# Patient Record
Sex: Female | Born: 1975 | Race: Black or African American | Hispanic: No | Marital: Single | Smoking: Never smoker
Health system: Southern US, Community
[De-identification: ages and names within clinical notes are randomized; demographics above are authoritative.]

## PROBLEM LIST (undated history)

## (undated) DIAGNOSIS — R918 Other nonspecific abnormal finding of lung field: Secondary | ICD-10-CM

## (undated) DIAGNOSIS — I5032 Chronic diastolic (congestive) heart failure: Secondary | ICD-10-CM

## (undated) DIAGNOSIS — K219 Gastro-esophageal reflux disease without esophagitis: Secondary | ICD-10-CM

## (undated) DIAGNOSIS — I351 Nonrheumatic aortic (valve) insufficiency: Secondary | ICD-10-CM

## (undated) DIAGNOSIS — I10 Essential (primary) hypertension: Secondary | ICD-10-CM

## (undated) DIAGNOSIS — J45909 Unspecified asthma, uncomplicated: Secondary | ICD-10-CM

## (undated) DIAGNOSIS — F32A Depression, unspecified: Secondary | ICD-10-CM

## (undated) DIAGNOSIS — E119 Type 2 diabetes mellitus without complications: Secondary | ICD-10-CM

## (undated) DIAGNOSIS — E785 Hyperlipidemia, unspecified: Secondary | ICD-10-CM

## (undated) DIAGNOSIS — I340 Nonrheumatic mitral (valve) insufficiency: Secondary | ICD-10-CM

## (undated) DIAGNOSIS — F329 Major depressive disorder, single episode, unspecified: Secondary | ICD-10-CM

## (undated) DIAGNOSIS — F419 Anxiety disorder, unspecified: Secondary | ICD-10-CM

## (undated) DIAGNOSIS — I2510 Atherosclerotic heart disease of native coronary artery without angina pectoris: Secondary | ICD-10-CM

## (undated) DIAGNOSIS — M1990 Unspecified osteoarthritis, unspecified site: Secondary | ICD-10-CM

## (undated) HISTORY — PX: WISDOM TOOTH EXTRACTION: SHX21

## (undated) HISTORY — DX: Type 2 diabetes mellitus without complications: E11.9

## (undated) HISTORY — DX: Essential (primary) hypertension: I10

## (undated) HISTORY — PX: CHOLECYSTECTOMY: SHX55

## (undated) HISTORY — DX: Gastro-esophageal reflux disease without esophagitis: K21.9

## (undated) HISTORY — DX: Hyperlipidemia, unspecified: E78.5

## (undated) HISTORY — DX: Major depressive disorder, single episode, unspecified: F32.9

## (undated) HISTORY — DX: Depression, unspecified: F32.A

---

## 2009-11-03 HISTORY — PX: TUBAL LIGATION: SHX77

## 2010-01-14 ENCOUNTER — Ambulatory Visit: Payer: Self-pay | Admitting: Obstetrics and Gynecology

## 2010-02-01 ENCOUNTER — Ambulatory Visit: Payer: Self-pay | Admitting: Obstetrics and Gynecology

## 2010-03-04 ENCOUNTER — Observation Stay: Payer: Self-pay | Admitting: Obstetrics and Gynecology

## 2010-05-09 ENCOUNTER — Observation Stay: Payer: Self-pay | Admitting: Obstetrics and Gynecology

## 2010-05-12 ENCOUNTER — Observation Stay: Payer: Self-pay

## 2010-06-10 ENCOUNTER — Inpatient Hospital Stay: Payer: Self-pay | Admitting: Obstetrics and Gynecology

## 2010-06-13 LAB — PATHOLOGY REPORT

## 2010-07-22 ENCOUNTER — Ambulatory Visit: Payer: Self-pay | Admitting: Obstetrics and Gynecology

## 2010-08-03 ENCOUNTER — Ambulatory Visit: Payer: Self-pay | Admitting: Obstetrics and Gynecology

## 2011-12-23 ENCOUNTER — Emergency Department: Payer: Self-pay | Admitting: Emergency Medicine

## 2011-12-23 LAB — CBC
HCT: 34 % — ABNORMAL LOW (ref 35.0–47.0)
HGB: 11.4 g/dL — ABNORMAL LOW (ref 12.0–16.0)
MCHC: 33.5 g/dL (ref 32.0–36.0)

## 2013-08-04 ENCOUNTER — Emergency Department: Payer: Self-pay | Admitting: Emergency Medicine

## 2013-09-28 ENCOUNTER — Ambulatory Visit: Payer: Self-pay | Admitting: Orthopedic Surgery

## 2013-10-06 DIAGNOSIS — G43009 Migraine without aura, not intractable, without status migrainosus: Secondary | ICD-10-CM | POA: Insufficient documentation

## 2013-10-06 DIAGNOSIS — E1140 Type 2 diabetes mellitus with diabetic neuropathy, unspecified: Secondary | ICD-10-CM | POA: Insufficient documentation

## 2013-10-18 ENCOUNTER — Ambulatory Visit: Payer: Self-pay | Admitting: Internal Medicine

## 2013-11-03 ENCOUNTER — Ambulatory Visit: Payer: Self-pay | Admitting: Internal Medicine

## 2018-02-04 DIAGNOSIS — I5032 Chronic diastolic (congestive) heart failure: Secondary | ICD-10-CM | POA: Insufficient documentation

## 2018-02-18 ENCOUNTER — Other Ambulatory Visit: Payer: Self-pay | Admitting: Specialist

## 2018-02-18 DIAGNOSIS — R911 Solitary pulmonary nodule: Secondary | ICD-10-CM

## 2018-02-24 ENCOUNTER — Ambulatory Visit
Admission: RE | Admit: 2018-02-24 | Discharge: 2018-02-24 | Disposition: A | Discharge status: Home / Self Care | Payer: Medicaid Other | Source: Ambulatory Visit | Attending: Specialist | Admitting: Specialist

## 2018-02-24 DIAGNOSIS — R911 Solitary pulmonary nodule: Secondary | ICD-10-CM | POA: Insufficient documentation

## 2018-02-24 LAB — GLUCOSE, CAPILLARY: GLUCOSE-CAPILLARY: 157 mg/dL — AB (ref 65–99)

## 2018-02-24 MED ORDER — FLUDEOXYGLUCOSE F - 18 (FDG) INJECTION
13.7800 | Freq: Once | INTRAVENOUS | Status: AC | PRN
  Administered 2018-02-24: 13.78 via INTRAVENOUS

## 2018-03-05 ENCOUNTER — Encounter: Payer: Self-pay | Admitting: Cardiothoracic Surgery

## 2018-03-05 ENCOUNTER — Ambulatory Visit (INDEPENDENT_AMBULATORY_CARE_PROVIDER_SITE_OTHER): Payer: Medicaid Other | Admitting: Cardiothoracic Surgery

## 2018-03-05 VITALS — BP 117/82 | HR 81 | Temp 98.2°F | Wt 263.0 lb

## 2018-03-05 DIAGNOSIS — R911 Solitary pulmonary nodule: Secondary | ICD-10-CM | POA: Diagnosis not present

## 2018-03-05 NOTE — Progress Notes (Signed)
Patient ID: Ashley Mullen, female   DOB: 06/17/1976, 42 y.o.   MRN: [id number]  Chief Complaint  Patient presents with  . New Patient (Initial Visit)    lung nodule     Referred By Dr. Anise Salvo Reason for Referral right lung mass  HPI Location, Quality, Duration, Severity, Timing, Context, Modifying Factors, Associated Signs and Symptoms.  Ashley Mullen is a 42 y.o. female.  She presented to her primary care physician Dr. Anise Salvo with complaints of increasing shortness of breath over the last several months.  An extensive cardiac evaluation was performed revealing evidence of mitral regurgitation and according to the patient evidence of congestive heart failure.  The patient states that as part of that she also had a CT scan of her chest and of her neck.  Unfortunately we do not have those CT scans to review.  The CT scan showed a right lower lobe nodule she was referred to Dr. Meredeth Ide for evaluation.  He obtained a PET scan which did not reveal any evidence of uptake in the 1.3 cm nodule however an adenocarcinoma was not excluded.  The recommendation was to have a CT scan of the chest in 3 months.  The patient states that although she is not a smoker she gets short of breath with minimal activities.  She is not able to walk up a flight of stairs.  She states that even some of the more routine activities of daily living make her short of breath.  She is never had a cough.  She does have a history of lung cancer in her family.  A maternal grandmother had lung cancer.  She is not been exposed to asbestos.  She has never been a smoker and she is only a social alcohol drinker.   Past Medical History:  Diagnosis Date  . Depression   . Diabetes mellitus without complication (HCC)   . GERD (gastroesophageal reflux disease)   . Hyperlipidemia   . Hypertension       Family History  Problem Relation Age of Onset  . Breast cancer Mother   . Bladder Cancer Father   . Lung cancer Maternal  Grandmother     Social History Social History   Tobacco Use  . Smoking status: Never Smoker  . Smokeless tobacco: Never Used  Substance Use Topics  . Alcohol use: Yes    Alcohol/week: 1.2 oz    Types: 1 Cans of beer, 1 Glasses of wine per week    Comment: 1-2  . Drug use: Never    No Known Allergies  Current Outpatient Medications  Medication Sig Dispense Refill  . citalopram (CELEXA) 20 MG tablet Take 20 mg by mouth daily.    . D3-50 50000 units capsule Take 1 capsule by mouth 1 day or 1 dose.  3  . enalapril (VASOTEC) 2.5 MG tablet Take 2.5 mg by mouth daily.    Marland Kitchen glimepiride (AMARYL) 4 MG tablet Take 1 tablet by mouth 1 day or 1 dose.  6  . hydrOXYzine (ATARAX/VISTARIL) 25 MG tablet Take 1 tablet by mouth 1 day or 1 dose.  3  . metFORMIN (GLUMETZA) 1000 MG (MOD) 24 hr tablet Take 1,000 mg by mouth daily with breakfast.    . metoprolol tartrate (LOPRESSOR) 100 MG tablet Take 1 tablet by mouth 1 day or 1 dose.  0  . pantoprazole (PROTONIX) 40 MG tablet Take 1 tablet by mouth 1 day or 1 dose.  6  . rosuvastatin (CRESTOR) 20  MG tablet Take 20 mg by mouth daily.     No current facility-administered medications for this visit.       Review of Systems A complete review of systems was asked and was negative except for the following positive findings chest pain, irregular heartbeat, swelling of her legs, shortness of breath, heartburn, headache, joint pain, depression, excessive thirst, bruising.  Blood pressure 117/82, pulse 81, temperature 98.2 F (36.8 C), temperature source Oral, weight 263 lb (119.3 kg), SpO2 96 %.  Physical Exam CONSTITUTIONAL:  Pleasant, well-developed, obese, and in no acute distress. EYES: Pupils equal and reactive to light, Sclera non-icteric EARS, NOSE, MOUTH AND THROAT:  The oropharynx was clear.  Dentition is good repair.  Oral mucosa pink and moist. LYMPH NODES:  Lymph nodes in the neck and axillae were normal RESPIRATORY:  Lungs were clear.   Normal respiratory effort without pathologic use of accessory muscles of respiration CARDIOVASCULAR: Heart was regular without murmurs.  There were no carotid bruits. GI: The abdomen was soft, nontender, and nondistended. There were no palpable masses. There was no hepatosplenomegaly. There were normal bowel sounds in all quadrants. GU:  Rectal deferred.   MUSCULOSKELETAL:  Normal muscle strength and tone.  No clubbing or cyanosis.   SKIN:  There were no pathologic skin lesions.  There were no nodules on palpation. NEUROLOGIC:  Sensation is normal.  Cranial nerves are grossly intact. PSYCH:  Oriented to person, place and time.  Mood and affect are normal.  Data Reviewed PET scan  I have personally reviewed the patient's imaging, laboratory findings and medical records.    Assessment    I have independently reviewed the patient's PET scan.  There is a 1.3 cm nodule in the right lower lobe which is not hypermetabolic.    Plan    I agree with the need to repeat her's chest CT in 3 months.  In addition we have asked the patient to obtain her CT scans and bring those to [place] so we can have them digitized.       Hulda Marin, MD 03/05/2018, 9:29 AM

## 2018-03-05 NOTE — Patient Instructions (Addendum)
We will see you back in 3 months. We will order your CT Scan and then your follow up appointment with Dr. Thelma Barge.  Please bring your copy of your CT Scan so Dr. Thelma Barge could compare the images.

## 2018-03-11 ENCOUNTER — Other Ambulatory Visit: Payer: Self-pay | Admitting: Internal Medicine

## 2018-03-11 DIAGNOSIS — Z1231 Encounter for screening mammogram for malignant neoplasm of breast: Secondary | ICD-10-CM

## 2018-03-11 LAB — RESULTS CONSOLE HPV: CHL HPV: NEGATIVE

## 2018-03-11 LAB — HM PAP SMEAR

## 2018-03-17 ENCOUNTER — Ambulatory Visit
Admission: RE | Admit: 2018-03-17 | Discharge: 2018-03-17 | Disposition: A | Discharge status: Home / Self Care | Payer: Self-pay | Source: Ambulatory Visit | Attending: Cardiothoracic Surgery | Admitting: Cardiothoracic Surgery

## 2018-03-17 ENCOUNTER — Other Ambulatory Visit: Payer: Self-pay | Admitting: Cardiothoracic Surgery

## 2018-03-17 DIAGNOSIS — R918 Other nonspecific abnormal finding of lung field: Secondary | ICD-10-CM

## 2018-04-02 ENCOUNTER — Ambulatory Visit
Admission: RE | Admit: 2018-04-02 | Discharge: 2018-04-02 | Disposition: A | Discharge status: Home / Self Care | Payer: Medicaid Other | Source: Ambulatory Visit | Attending: Internal Medicine | Admitting: Internal Medicine

## 2018-04-02 ENCOUNTER — Encounter: Payer: Self-pay | Admitting: Radiology

## 2018-04-02 DIAGNOSIS — Z1231 Encounter for screening mammogram for malignant neoplasm of breast: Secondary | ICD-10-CM | POA: Diagnosis present

## 2018-05-24 ENCOUNTER — Other Ambulatory Visit: Payer: Self-pay

## 2018-05-24 DIAGNOSIS — R911 Solitary pulmonary nodule: Secondary | ICD-10-CM

## 2018-05-24 NOTE — Progress Notes (Signed)
c 

## 2018-06-02 ENCOUNTER — Other Ambulatory Visit: Payer: Self-pay

## 2018-06-03 ENCOUNTER — Ambulatory Visit
Admission: RE | Admit: 2018-06-03 | Discharge: 2018-06-03 | Disposition: A | Discharge status: Home / Self Care | Payer: Medicaid Other | Source: Ambulatory Visit | Attending: Cardiothoracic Surgery | Admitting: Cardiothoracic Surgery

## 2018-06-03 DIAGNOSIS — R918 Other nonspecific abnormal finding of lung field: Secondary | ICD-10-CM | POA: Diagnosis not present

## 2018-06-03 DIAGNOSIS — R911 Solitary pulmonary nodule: Secondary | ICD-10-CM

## 2018-06-04 ENCOUNTER — Encounter: Payer: Self-pay | Admitting: Cardiothoracic Surgery

## 2018-06-04 ENCOUNTER — Ambulatory Visit (INDEPENDENT_AMBULATORY_CARE_PROVIDER_SITE_OTHER): Payer: Medicaid Other | Admitting: Cardiothoracic Surgery

## 2018-06-04 VITALS — BP 151/90 | HR 73 | Temp 97.9°F | Resp 18 | Ht 69.0 in | Wt 265.2 lb

## 2018-06-04 DIAGNOSIS — R911 Solitary pulmonary nodule: Secondary | ICD-10-CM

## 2018-06-04 NOTE — Progress Notes (Signed)
  Patient ID: Ashley Mullen, female   DOB: February 11, 1976, 42 y.o.   MRN: [id number]  HISTORY: She returns today in follow-up.  She had a repeat CT scan done yesterday.  She has no complaints.  She states that her breathing is back to baseline.  She does not complain of any fevers or chills.   Vitals:   06/04/18 0755  BP: (!) 151/90  Pulse: 73  Resp: 18  Temp: 97.9 F (36.6 C)  SpO2: 98%     EXAM:    Resp: Lungs are clear bilaterally.  No respiratory distress, normal effort. Heart:  Regular without murmurs Abd:  Abdomen is soft, non distended and non tender. No masses are palpable.  There is no rebound and no guarding.  Neurological: Alert and oriented to person, place, and time. Coordination normal.  Skin: Skin is warm and dry. No rash noted. No diaphoretic. No erythema. No pallor.  Psychiatric: Normal mood and affect. Normal behavior. Judgment and thought content normal.    ASSESSMENT: I have independently reviewed her chest CT.  The questionable lesion in the right lower lobe has almost completely resolved.   PLAN:   We will see the patient back again in 2 years which was the recommendation of our radiologist.  She knows to contact us sooner if there are any problems.    Hulda Marinimothy Joliyah Lippens, MD

## 2018-06-04 NOTE — Patient Instructions (Signed)
WE will call you in 2 years for your follow up appointment.

## 2018-07-22 DIAGNOSIS — I10 Essential (primary) hypertension: Secondary | ICD-10-CM | POA: Insufficient documentation

## 2018-07-22 DIAGNOSIS — E538 Deficiency of other specified B group vitamins: Secondary | ICD-10-CM | POA: Insufficient documentation

## 2018-07-22 DIAGNOSIS — E782 Mixed hyperlipidemia: Secondary | ICD-10-CM | POA: Insufficient documentation

## 2018-10-21 DIAGNOSIS — F411 Generalized anxiety disorder: Secondary | ICD-10-CM | POA: Insufficient documentation

## 2019-01-07 ENCOUNTER — Other Ambulatory Visit: Payer: Self-pay | Admitting: Specialist

## 2019-01-07 DIAGNOSIS — R918 Other nonspecific abnormal finding of lung field: Secondary | ICD-10-CM

## 2019-02-08 ENCOUNTER — Other Ambulatory Visit: Payer: Self-pay | Admitting: Family

## 2019-02-08 DIAGNOSIS — Z1231 Encounter for screening mammogram for malignant neoplasm of breast: Secondary | ICD-10-CM

## 2019-04-07 ENCOUNTER — Other Ambulatory Visit: Payer: Self-pay

## 2019-04-07 ENCOUNTER — Ambulatory Visit
Admission: RE | Admit: 2019-04-07 | Discharge: 2019-04-07 | Disposition: A | Discharge status: Home / Self Care | Payer: Medicaid Other | Source: Ambulatory Visit | Attending: Family | Admitting: Family

## 2019-04-07 DIAGNOSIS — Z1231 Encounter for screening mammogram for malignant neoplasm of breast: Secondary | ICD-10-CM | POA: Insufficient documentation

## 2020-01-02 ENCOUNTER — Ambulatory Visit: Payer: Medicaid Other

## 2020-01-17 ENCOUNTER — Ambulatory Visit: Payer: Medicaid Other

## 2020-01-30 ENCOUNTER — Other Ambulatory Visit: Payer: Self-pay | Admitting: Specialist

## 2020-01-30 DIAGNOSIS — R918 Other nonspecific abnormal finding of lung field: Secondary | ICD-10-CM

## 2020-01-30 DIAGNOSIS — J849 Interstitial pulmonary disease, unspecified: Secondary | ICD-10-CM

## 2020-02-13 ENCOUNTER — Ambulatory Visit
Admission: RE | Admit: 2020-02-13 | Discharge: 2020-02-13 | Disposition: A | Discharge status: Home / Self Care | Payer: Medicaid Other | Source: Ambulatory Visit | Attending: Specialist | Admitting: Specialist

## 2020-02-13 ENCOUNTER — Other Ambulatory Visit: Payer: Self-pay

## 2020-02-13 DIAGNOSIS — R918 Other nonspecific abnormal finding of lung field: Secondary | ICD-10-CM

## 2020-02-13 DIAGNOSIS — J849 Interstitial pulmonary disease, unspecified: Secondary | ICD-10-CM | POA: Diagnosis present

## 2020-02-26 IMAGING — CT NM PET TUM IMG INITIAL (PI) SKULL BASE T - THIGH
1 of 8 series · 1 of 25 positions shown · non-contrast
Comparison: None.

CLINICAL DATA: Initial treatment strategy for pulmonary nodule.

EXAM:
NUCLEAR MEDICINE PET SKULL BASE TO THIGH
TECHNIQUE: 13.8 mCi F-18 FDG was injected intravenously. Full-ring PET imaging
was performed from the skull base to thigh after the radiotracer. CT
data was obtained and used for attenuation correction and anatomic
localization.
Fasting blood glucose: 157 mg/dl

[Series 4: ct wb 5.0 b30f · axial · 5.0mm · 0.98mm/px · 1 of 290 slices shown]
[im 290/290  brain]
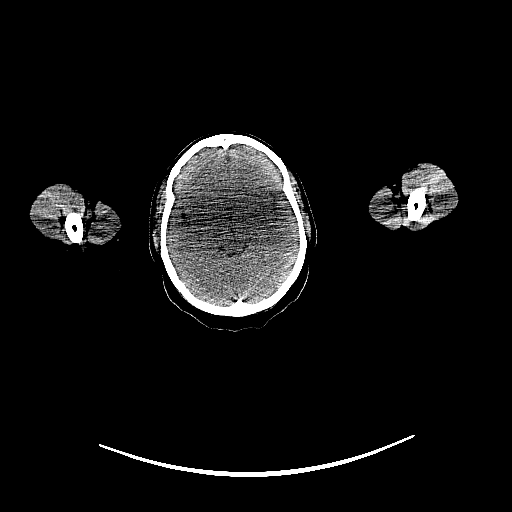

[1 of 25 positions shown; findings below may reference images not displayed]

FINDINGS: Mediastinal blood pool activity: SUV max

NECK: No hypermetabolic lymph nodes in the neck.

Incidental CT findings: None.

CHEST: No hypermetabolic mediastinal, hilar or axillary lymph nodes.
11 mm (9 x 13 mm) nodule in the right lower lobe (CT image 94) has
an SUV max of 1.6.

Incidental CT findings: None.

ABDOMEN/PELVIS: No abnormal hypermetabolism in the liver, adrenal
glands, spleen or pancreas. No hypermetabolic lymph nodes.

Incidental CT findings: Small periumbilical hernia contains a
knuckle of small bowel.

SKELETON: No abnormal osseous hypermetabolism.

Incidental CT findings: None.
IMPRESSION: 11 mm right lower lobe nodule is not hypermetabolic and is likely
benign. Follow-up CT chest without contrast is recommended in 3
months in further initial evaluation, as low-grade adenocarcinoma
cannot be definitively excluded.

## 2020-03-07 ENCOUNTER — Other Ambulatory Visit: Payer: Self-pay | Admitting: Internal Medicine

## 2020-03-07 DIAGNOSIS — Z1231 Encounter for screening mammogram for malignant neoplasm of breast: Secondary | ICD-10-CM

## 2020-04-09 ENCOUNTER — Ambulatory Visit
Admission: RE | Admit: 2020-04-09 | Discharge: 2020-04-09 | Disposition: A | Discharge status: Home / Self Care | Payer: Medicaid Other | Source: Ambulatory Visit | Attending: Internal Medicine | Admitting: Internal Medicine

## 2020-04-09 DIAGNOSIS — Z1231 Encounter for screening mammogram for malignant neoplasm of breast: Secondary | ICD-10-CM | POA: Insufficient documentation

## 2020-05-21 ENCOUNTER — Encounter: Payer: Self-pay | Admitting: Emergency Medicine

## 2020-05-21 ENCOUNTER — Emergency Department: Payer: Medicaid Other

## 2020-05-21 DIAGNOSIS — R079 Chest pain, unspecified: Secondary | ICD-10-CM | POA: Diagnosis present

## 2020-05-21 DIAGNOSIS — R7401 Elevation of levels of liver transaminase levels: Secondary | ICD-10-CM | POA: Insufficient documentation

## 2020-05-21 DIAGNOSIS — Z79899 Other long term (current) drug therapy: Secondary | ICD-10-CM | POA: Diagnosis not present

## 2020-05-21 DIAGNOSIS — K851 Biliary acute pancreatitis without necrosis or infection: Secondary | ICD-10-CM | POA: Insufficient documentation

## 2020-05-21 DIAGNOSIS — E119 Type 2 diabetes mellitus without complications: Secondary | ICD-10-CM | POA: Insufficient documentation

## 2020-05-21 DIAGNOSIS — K819 Cholecystitis, unspecified: Principal | ICD-10-CM | POA: Insufficient documentation

## 2020-05-21 DIAGNOSIS — I10 Essential (primary) hypertension: Secondary | ICD-10-CM | POA: Insufficient documentation

## 2020-05-21 DIAGNOSIS — Z20822 Contact with and (suspected) exposure to covid-19: Secondary | ICD-10-CM | POA: Diagnosis not present

## 2020-05-21 DIAGNOSIS — R1011 Right upper quadrant pain: Secondary | ICD-10-CM | POA: Insufficient documentation

## 2020-05-21 DIAGNOSIS — Z7984 Long term (current) use of oral hypoglycemic drugs: Secondary | ICD-10-CM | POA: Insufficient documentation

## 2020-05-21 LAB — BASIC METABOLIC PANEL
Anion gap: 11 (ref 5–15)
BUN: 13 mg/dL (ref 6–20)
CO2: 23 mmol/L (ref 22–32)
Calcium: 9.1 mg/dL (ref 8.9–10.3)
Chloride: 106 mmol/L (ref 98–111)
Creatinine, Ser: 0.77 mg/dL (ref 0.44–1.00)
GFR calc Af Amer: >60 mL/min (ref >60)
GFR calc non Af Amer: >60 mL/min (ref >60)
Glucose, Bld: 139 mg/dL — ABNORMAL HIGH (ref 70–99)
Potassium: 3 mmol/L — ABNORMAL LOW (ref 3.5–5.1)
Sodium: 140 mmol/L (ref 135–145)

## 2020-05-21 LAB — TROPONIN I (HIGH SENSITIVITY): Troponin I (High Sensitivity): 2 ng/L (ref <18)

## 2020-05-21 LAB — CBC
HCT: 37.4 % (ref 36.0–46.0)
Hemoglobin: 12.2 g/dL (ref 12.0–15.0)
MCH: 29.4 pg (ref 26.0–34.0)
MCHC: 32.6 g/dL (ref 30.0–36.0)
MCV: 90.1 fL (ref 80.0–100.0)
Platelets: 293 10*3/uL (ref 150–400)
RBC: 4.15 MIL/uL (ref 3.87–5.11)
RDW: 12.4 % (ref 11.5–15.5)
WBC: 8.8 10*3/uL (ref 4.0–10.5)
nRBC: 0 % (ref 0.0–0.2)

## 2020-05-21 MED ORDER — SODIUM CHLORIDE 0.9% FLUSH
3.0000 mL | Freq: Once | INTRAVENOUS | Status: AC
  Administered 2020-05-22: 3 mL via INTRAVENOUS

## 2020-05-21 NOTE — ED Triage Notes (Signed)
Pt arrived via EMS from home where pt started to have substernal chest pain x1 hour ago after eating dinner. Pt received 324mg  Asprin and 1 Nitro spray with no relief. Pt denies SOB, N/V.

## 2020-05-22 ENCOUNTER — Encounter: Payer: Self-pay | Admitting: Surgery

## 2020-05-22 ENCOUNTER — Observation Stay: Payer: Medicaid Other | Admitting: Anesthesiology

## 2020-05-22 ENCOUNTER — Other Ambulatory Visit: Payer: Self-pay

## 2020-05-22 ENCOUNTER — Encounter
Admission: EM | Disposition: A | Discharge status: Home / Self Care | Payer: Self-pay | Source: Home / Self Care | Attending: Emergency Medicine

## 2020-05-22 ENCOUNTER — Emergency Department: Payer: Medicaid Other

## 2020-05-22 ENCOUNTER — Observation Stay
Admission: EM | Admit: 2020-05-22 | Discharge: 2020-05-23 | Disposition: A | Discharge status: Home / Self Care | Payer: Medicaid Other | Attending: Surgery | Admitting: Surgery

## 2020-05-22 DIAGNOSIS — R1011 Right upper quadrant pain: Secondary | ICD-10-CM

## 2020-05-22 DIAGNOSIS — K819 Cholecystitis, unspecified: Secondary | ICD-10-CM | POA: Diagnosis present

## 2020-05-22 DIAGNOSIS — R7401 Elevation of levels of liver transaminase levels: Secondary | ICD-10-CM

## 2020-05-22 DIAGNOSIS — R109 Unspecified abdominal pain: Secondary | ICD-10-CM

## 2020-05-22 DIAGNOSIS — K851 Biliary acute pancreatitis without necrosis or infection: Secondary | ICD-10-CM

## 2020-05-22 LAB — LIPASE, BLOOD: Lipase: 4289 U/L — ABNORMAL HIGH (ref 11–51)

## 2020-05-22 LAB — TROPONIN I (HIGH SENSITIVITY): Troponin I (High Sensitivity): 3 ng/L (ref <18)

## 2020-05-22 LAB — CREATININE, SERUM
Creatinine, Ser: 0.59 mg/dL (ref 0.44–1.00)
GFR calc Af Amer: >60 mL/min (ref >60)
GFR calc non Af Amer: >60 mL/min (ref >60)

## 2020-05-22 LAB — HEPATIC FUNCTION PANEL
ALT: 239 U/L — ABNORMAL HIGH (ref 0–44)
AST: 840 U/L — ABNORMAL HIGH (ref 15–41)
Albumin: 4 g/dL (ref 3.5–5.0)
Alkaline Phosphatase: 136 U/L — ABNORMAL HIGH (ref 38–126)
Bilirubin, Direct: 0.3 mg/dL — ABNORMAL HIGH (ref 0.0–0.2)
Indirect Bilirubin: 0.7 mg/dL (ref 0.3–0.9)
Total Bilirubin: 1 mg/dL (ref 0.3–1.2)
Total Protein: 7.4 g/dL (ref 6.5–8.1)

## 2020-05-22 LAB — CBC
HCT: 33.9 % — ABNORMAL LOW (ref 36.0–46.0)
Hemoglobin: 11.4 g/dL — ABNORMAL LOW (ref 12.0–15.0)
MCH: 30.3 pg (ref 26.0–34.0)
MCHC: 33.6 g/dL (ref 30.0–36.0)
MCV: 90.2 fL (ref 80.0–100.0)
Platelets: 220 10*3/uL (ref 150–400)
RBC: 3.76 MIL/uL — ABNORMAL LOW (ref 3.87–5.11)
RDW: 12.4 % (ref 11.5–15.5)
WBC: 6.4 10*3/uL (ref 4.0–10.5)
nRBC: 0 % (ref 0.0–0.2)

## 2020-05-22 LAB — SARS CORONAVIRUS 2 BY RT PCR (HOSPITAL ORDER, PERFORMED IN ~~LOC~~ HOSPITAL LAB): SARS Coronavirus 2: NEGATIVE

## 2020-05-22 LAB — PREGNANCY, URINE: Preg Test, Ur: NEGATIVE

## 2020-05-22 LAB — POTASSIUM: Potassium: 3.1 mmol/L — ABNORMAL LOW (ref 3.5–5.1)

## 2020-05-22 LAB — HIV ANTIBODY (ROUTINE TESTING W REFLEX): HIV Screen 4th Generation wRfx: NONREACTIVE

## 2020-05-22 LAB — GLUCOSE, CAPILLARY: Glucose-Capillary: 135 mg/dL — ABNORMAL HIGH (ref 70–99)

## 2020-05-22 SURGERY — CHOLECYSTECTOMY, ROBOT-ASSISTED, LAPAROSCOPIC
Anesthesia: General

## 2020-05-22 MED ORDER — BUPIVACAINE-EPINEPHRINE (PF) 0.25% -1:200000 IJ SOLN
INTRAMUSCULAR | Status: AC
  Filled 2020-05-22: qty 30

## 2020-05-22 MED ORDER — ONDANSETRON HCL 4 MG/2ML IJ SOLN
4.0000 mg | Freq: Once | INTRAMUSCULAR | Status: AC
  Administered 2020-05-22: 4 mg via INTRAVENOUS
  Filled 2020-05-22: qty 2

## 2020-05-22 MED ORDER — PROCHLORPERAZINE EDISYLATE 10 MG/2ML IJ SOLN
5.0000 mg | Freq: Four times a day (QID) | INTRAMUSCULAR | Status: DC | PRN
  Administered 2020-05-22: 10 mg via INTRAVENOUS
  Filled 2020-05-22: qty 2

## 2020-05-22 MED ORDER — DEXAMETHASONE SODIUM PHOSPHATE 10 MG/ML IJ SOLN
INTRAMUSCULAR | Status: AC
  Filled 2020-05-22: qty 1

## 2020-05-22 MED ORDER — FAMOTIDINE IN NACL 20-0.9 MG/50ML-% IV SOLN
20.0000 mg | Freq: Once | INTRAVENOUS | Status: AC
  Administered 2020-05-22: 20 mg via INTRAVENOUS
  Filled 2020-05-22: qty 50

## 2020-05-22 MED ORDER — HEPARIN SODIUM (PORCINE) 5000 UNIT/ML IJ SOLN
5000.0000 [IU] | Freq: Three times a day (TID) | INTRAMUSCULAR | Status: DC
  Administered 2020-05-22 – 2020-05-23 (×3): 5000 [IU] via SUBCUTANEOUS
  Filled 2020-05-22 (×2): qty 1

## 2020-05-22 MED ORDER — MORPHINE SULFATE (PF) 2 MG/ML IV SOLN
2.0000 mg | INTRAVENOUS | Status: DC | PRN
  Administered 2020-05-22: 2 mg via INTRAVENOUS
  Filled 2020-05-22: qty 1

## 2020-05-22 MED ORDER — LIDOCAINE HCL (PF) 2 % IJ SOLN
INTRAMUSCULAR | Status: AC
  Filled 2020-05-22: qty 5

## 2020-05-22 MED ORDER — ONDANSETRON HCL 4 MG/2ML IJ SOLN: 4.0000 mg | Freq: Four times a day (QID) | INTRAMUSCULAR | Status: DC | PRN

## 2020-05-22 MED ORDER — ONDANSETRON 4 MG PO TBDP: 4.0000 mg | ORAL_TABLET | Freq: Four times a day (QID) | ORAL | Status: DC | PRN

## 2020-05-22 MED ORDER — LACTATED RINGERS IV BOLUS
1000.0000 mL | Freq: Once | INTRAVENOUS | Status: AC
  Administered 2020-05-22: 1000 mL via INTRAVENOUS

## 2020-05-22 MED ORDER — ROCURONIUM BROMIDE 100 MG/10ML IV SOLN
INTRAVENOUS | Status: DC | PRN
  Administered 2020-05-22: 10 mg via INTRAVENOUS
  Administered 2020-05-22: 50 mg via INTRAVENOUS

## 2020-05-22 MED ORDER — FENTANYL CITRATE (PF) 100 MCG/2ML IJ SOLN
INTRAMUSCULAR | Status: DC | PRN
  Administered 2020-05-22 (×3): 50 ug via INTRAVENOUS

## 2020-05-22 MED ORDER — ONDANSETRON HCL 4 MG/2ML IJ SOLN
INTRAMUSCULAR | Status: AC
  Filled 2020-05-22: qty 2

## 2020-05-22 MED ORDER — FENTANYL CITRATE (PF) 100 MCG/2ML IJ SOLN
INTRAMUSCULAR | Status: AC
  Filled 2020-05-22: qty 2

## 2020-05-22 MED ORDER — GADOBUTROL 1 MMOL/ML IV SOLN
10.0000 mL | Freq: Once | INTRAVENOUS | Status: AC | PRN
  Administered 2020-05-22: 10 mL via INTRAVENOUS

## 2020-05-22 MED ORDER — METOCLOPRAMIDE HCL 5 MG/ML IJ SOLN
10.0000 mg | Freq: Once | INTRAMUSCULAR | Status: AC
  Administered 2020-05-22: 10 mg via INTRAVENOUS
  Filled 2020-05-22: qty 2

## 2020-05-22 MED ORDER — PANTOPRAZOLE SODIUM 40 MG IV SOLR
40.0000 mg | Freq: Every evening | INTRAVENOUS | Status: DC
  Administered 2020-05-22: 40 mg via INTRAVENOUS
  Filled 2020-05-22: qty 40

## 2020-05-22 MED ORDER — SODIUM CHLORIDE 0.9 % IV SOLN: INTRAVENOUS | Status: DC

## 2020-05-22 MED ORDER — KETOROLAC TROMETHAMINE 30 MG/ML IJ SOLN
15.0000 mg | Freq: Once | INTRAMUSCULAR | Status: AC
  Administered 2020-05-22: 15 mg via INTRAVENOUS
  Filled 2020-05-22: qty 1

## 2020-05-22 MED ORDER — INDOCYANINE GREEN 25 MG IV SOLR: 5.0000 mg | Freq: Once | INTRAVENOUS | Status: DC

## 2020-05-22 MED ORDER — ACETAMINOPHEN 500 MG PO TABS
1000.0000 mg | ORAL_TABLET | Freq: Four times a day (QID) | ORAL | Status: DC
  Administered 2020-05-22 – 2020-05-23 (×4): 1000 mg via ORAL
  Filled 2020-05-22 (×4): qty 2

## 2020-05-22 MED ORDER — MIDAZOLAM HCL 2 MG/2ML IJ SOLN
INTRAMUSCULAR | Status: DC | PRN
  Administered 2020-05-22: 2 mg via INTRAVENOUS

## 2020-05-22 MED ORDER — PIPERACILLIN-TAZOBACTAM 3.375 G IVPB 30 MIN
3.3750 g | Freq: Once | INTRAVENOUS | Status: AC
  Administered 2020-05-22: 3.375 g via INTRAVENOUS
  Filled 2020-05-22: qty 50

## 2020-05-22 MED ORDER — FENTANYL CITRATE (PF) 100 MCG/2ML IJ SOLN
INTRAMUSCULAR | Status: AC
  Administered 2020-05-22: 25 ug via INTRAVENOUS
  Filled 2020-05-22: qty 2

## 2020-05-22 MED ORDER — FENTANYL CITRATE (PF) 250 MCG/5ML IJ SOLN
INTRAMUSCULAR | Status: AC
  Filled 2020-05-22: qty 5

## 2020-05-22 MED ORDER — HYDROMORPHONE HCL 1 MG/ML IJ SOLN
0.5000 mg | Freq: Once | INTRAMUSCULAR | Status: AC
  Administered 2020-05-22: 0.5 mg via INTRAVENOUS
  Filled 2020-05-22: qty 1

## 2020-05-22 MED ORDER — FENTANYL CITRATE (PF) 100 MCG/2ML IJ SOLN
25.0000 ug | INTRAMUSCULAR | Status: DC | PRN
  Administered 2020-05-22 (×3): 25 ug via INTRAVENOUS

## 2020-05-22 MED ORDER — PROPOFOL 10 MG/ML IV BOLUS
INTRAVENOUS | Status: AC
  Filled 2020-05-22: qty 40

## 2020-05-22 MED ORDER — LIDOCAINE HCL (CARDIAC) PF 100 MG/5ML IV SOSY
PREFILLED_SYRINGE | INTRAVENOUS | Status: DC | PRN
  Administered 2020-05-22: 80 mg via INTRAVENOUS

## 2020-05-22 MED ORDER — KETOROLAC TROMETHAMINE 30 MG/ML IJ SOLN: 30.0000 mg | Freq: Four times a day (QID) | INTRAMUSCULAR | Status: DC | PRN

## 2020-05-22 MED ORDER — PROMETHAZINE HCL 25 MG/ML IJ SOLN: 6.2500 mg | INTRAMUSCULAR | Status: DC | PRN

## 2020-05-22 MED ORDER — PIPERACILLIN-TAZOBACTAM 3.375 G IVPB
3.3750 g | Freq: Three times a day (TID) | INTRAVENOUS | Status: DC
  Administered 2020-05-22 – 2020-05-23 (×2): 3.375 g via INTRAVENOUS
  Filled 2020-05-22 (×3): qty 50

## 2020-05-22 MED ORDER — FENTANYL CITRATE (PF) 100 MCG/2ML IJ SOLN
50.0000 ug | Freq: Once | INTRAMUSCULAR | Status: AC
  Administered 2020-05-22: 50 ug via INTRAVENOUS
  Filled 2020-05-22: qty 2

## 2020-05-22 MED ORDER — MORPHINE SULFATE (PF) 4 MG/ML IV SOLN
4.0000 mg | Freq: Once | INTRAVENOUS | Status: AC
  Administered 2020-05-22: 4 mg via INTRAVENOUS
  Filled 2020-05-22: qty 1

## 2020-05-22 MED ORDER — ROCURONIUM BROMIDE 10 MG/ML (PF) SYRINGE
PREFILLED_SYRINGE | INTRAVENOUS | Status: AC
  Filled 2020-05-22: qty 10

## 2020-05-22 MED ORDER — DEXAMETHASONE SODIUM PHOSPHATE 10 MG/ML IJ SOLN
INTRAMUSCULAR | Status: DC | PRN
  Administered 2020-05-22: 5 mg via INTRAVENOUS

## 2020-05-22 MED ORDER — OXYCODONE HCL 5 MG PO TABS
5.0000 mg | ORAL_TABLET | ORAL | Status: DC | PRN
  Administered 2020-05-22: 5 mg via ORAL
  Administered 2020-05-23 (×2): 10 mg via ORAL
  Filled 2020-05-22: qty 2
  Filled 2020-05-22: qty 1
  Filled 2020-05-22: qty 2

## 2020-05-22 MED ORDER — BUPIVACAINE-EPINEPHRINE (PF) 0.25% -1:200000 IJ SOLN
INTRAMUSCULAR | Status: DC | PRN
  Administered 2020-05-22: 30 mL via PERINEURAL

## 2020-05-22 MED ORDER — PROCHLORPERAZINE MALEATE 10 MG PO TABS
10.0000 mg | ORAL_TABLET | Freq: Four times a day (QID) | ORAL | Status: DC | PRN
  Filled 2020-05-22: qty 1

## 2020-05-22 MED ORDER — PROPOFOL 10 MG/ML IV BOLUS
INTRAVENOUS | Status: DC | PRN
  Administered 2020-05-22: 150 mg via INTRAVENOUS

## 2020-05-22 MED ORDER — SUGAMMADEX SODIUM 200 MG/2ML IV SOLN
INTRAVENOUS | Status: DC | PRN
  Administered 2020-05-22: 200 mg via INTRAVENOUS

## 2020-05-22 MED ORDER — MIDAZOLAM HCL 2 MG/2ML IJ SOLN
INTRAMUSCULAR | Status: AC
  Filled 2020-05-22: qty 2

## 2020-05-22 SURGICAL SUPPLY — 48 items
CANISTER SUCT 1200ML W/VALVE (MISCELLANEOUS) ×4 IMPLANT
CANNULA REDUC XI 12-8 STAPL (CANNULA) ×1
CANNULA REDUC XI 12-8MM STAPL (CANNULA) ×1
CANNULA REDUCER 12-8 DVNC XI (CANNULA) ×2 IMPLANT
CHLORAPREP W/TINT 26 (MISCELLANEOUS) ×4 IMPLANT
CLIP VESOLOCK MED LG 6/CT (CLIP) ×4 IMPLANT
COVER WAND RF STERILE (DRAPES) ×4 IMPLANT
DECANTER SPIKE VIAL GLASS SM (MISCELLANEOUS) ×4 IMPLANT
DEFOGGER SCOPE WARMER CLEARIFY (MISCELLANEOUS) ×4 IMPLANT
DERMABOND ADVANCED (GAUZE/BANDAGES/DRESSINGS) ×2
DERMABOND ADVANCED .7 DNX12 (GAUZE/BANDAGES/DRESSINGS) ×2 IMPLANT
DRAPE ARM DVNC X/XI (DISPOSABLE) ×8 IMPLANT
DRAPE COLUMN DVNC XI (DISPOSABLE) ×2 IMPLANT
DRAPE DA VINCI XI ARM (DISPOSABLE) ×8
DRAPE DA VINCI XI COLUMN (DISPOSABLE) ×2
ELECT CAUTERY BLADE 6.4 (BLADE) ×4 IMPLANT
ELECT REM PT RETURN 9FT ADLT (ELECTROSURGICAL) ×4
ELECTRODE REM PT RTRN 9FT ADLT (ELECTROSURGICAL) ×2 IMPLANT
GLOVE BIO SURGEON STRL SZ7 (GLOVE) ×8 IMPLANT
GOWN STRL REUS W/ TWL LRG LVL3 (GOWN DISPOSABLE) ×8 IMPLANT
GOWN STRL REUS W/TWL LRG LVL3 (GOWN DISPOSABLE) ×8
IRRIGATION STRYKERFLOW (MISCELLANEOUS) IMPLANT
IRRIGATOR STRYKERFLOW (MISCELLANEOUS)
IV NS 1000ML (IV SOLUTION)
IV NS 1000ML BAXH (IV SOLUTION) IMPLANT
KIT PINK PAD W/HEAD ARE REST (MISCELLANEOUS) ×4
KIT PINK PAD W/HEAD ARM REST (MISCELLANEOUS) ×2 IMPLANT
LABEL OR SOLS (LABEL) ×4 IMPLANT
NEEDLE HYPO 22GX1.5 SAFETY (NEEDLE) ×4 IMPLANT
NS IRRIG 500ML POUR BTL (IV SOLUTION) ×4 IMPLANT
OBTURATOR OPTICAL STANDARD 8MM (TROCAR) ×2
OBTURATOR OPTICAL STND 8 DVNC (TROCAR) ×2
OBTURATOR OPTICALSTD 8 DVNC (TROCAR) ×2 IMPLANT
PACK LAP CHOLECYSTECTOMY (MISCELLANEOUS) ×4 IMPLANT
PENCIL ELECTRO HAND CTR (MISCELLANEOUS) ×4 IMPLANT
POUCH SPECIMEN RETRIEVAL 10MM (ENDOMECHANICALS) ×4 IMPLANT
SEAL CANN UNIV 5-8 DVNC XI (MISCELLANEOUS) ×6 IMPLANT
SEAL XI 5MM-8MM UNIVERSAL (MISCELLANEOUS) ×6
SET TUBE SMOKE EVAC HIGH FLOW (TUBING) ×4 IMPLANT
SOLUTION ELECTROLUBE (MISCELLANEOUS) ×4 IMPLANT
SPONGE LAP 18X18 RF (DISPOSABLE) ×4 IMPLANT
SPONGE LAP 4X18 RFD (DISPOSABLE) ×4 IMPLANT
STAPLER CANNULA SEAL DVNC XI (STAPLE) ×2 IMPLANT
STAPLER CANNULA SEAL XI (STAPLE) ×2
SUT MNCRL AB 4-0 PS2 18 (SUTURE) ×4 IMPLANT
SUT VICRYL 0 AB UR-6 (SUTURE) ×8 IMPLANT
TAPE TRANSPORE STRL 2 31045 (GAUZE/BANDAGES/DRESSINGS) ×4 IMPLANT
TROCAR BALLN GELPORT 12X130M (ENDOMECHANICALS) ×4 IMPLANT

## 2020-05-22 NOTE — H&P (Signed)
Patient ID: Ashley Mullen, female   DOB: [birth year], 44 y.o.   MRN: [id number]  HPI Ashley Mullen is a 44 y.o. female with significant history of hypertension and diabetes initially came to the emergency room last night complaining of epigastric pain and lower chest pain.  Patient reports that the pain was sharp constant and moderate to severe in nature.  Apparently she had some dinner and then shortly after developed the pain.  She also has associated nausea and vomiting.  No fevers no chills no evidence of obstructive jaundice.  Note the patient does not have a history of coronary artery disease and here her troponins were completely normal.  Her EKG is also completely normal. She underwent an ultrasound as well as an MRCP that I have personally reviewed there is evidence of gallbladder thickening but no evidence of choledocholithiasis.  There is evidence to suggest cholecystitis.  CBC was reviewed and her hemoglobin is 11.4.  He does have significant elevation of the AST and ALT as well as alkaline phosphatase therefore I requested an MRCP showing no evidence of choledocholithiasis. She is able to perform more than 4 METS of activities and the only abdominal operation was a tubal ligation in the past   HPI  Past Medical History:  Diagnosis Date  . Depression   . Diabetes mellitus without complication (HCC)   . GERD (gastroesophageal reflux disease)   . Hyperlipidemia   . Hypertension     Past Surgical History:  Procedure Laterality Date  . TUBAL LIGATION  2011    Family History  Problem Relation Age of Onset  . Breast cancer Mother        58's  . Bladder Cancer Father   . Lung cancer Maternal Grandmother   . Breast cancer Paternal Aunt     Social History Social History   Tobacco Use  . Smoking status: Never Smoker  . Smokeless tobacco: Never Used  Substance Use Topics  . Alcohol use: Yes    Alcohol/week: 2.0 standard drinks    Types: 1 Cans of beer, 1 Glasses of wine per  week    Comment: 1-2  . Drug use: Never    No Known Allergies  Current Facility-Administered Medications  Medication Dose Route Frequency Provider Last Rate Last Admin  . 0.9 %  sodium chloride infusion   Intravenous Continuous Leafy Ro, MD 125 mL/hr at 05/22/20 0555 New Bag at 05/22/20 0555  . acetaminophen (TYLENOL) tablet 1,000 mg  1,000 mg Oral Q6H Shirell Struthers F, MD   1,000 mg at 05/22/20 0709  . heparin injection 5,000 Units  5,000 Units Subcutaneous Q8H Shaquan Puerta F, MD   5,000 Units at 05/22/20 0710  . ketorolac (TORADOL) 30 MG/ML injection 30 mg  30 mg Intravenous Q6H PRN Bay Jarquin F, MD      . morphine 2 MG/ML injection 2 mg  2 mg Intravenous Q3H PRN Wyolene Weimann F, MD      . ondansetron (ZOFRAN-ODT) disintegrating tablet 4 mg  4 mg Oral Q6H PRN Danniel Tones F, MD       Or  . ondansetron (ZOFRAN) injection 4 mg  4 mg Intravenous Q6H PRN Daija Routson F, MD      . oxyCODONE (Oxy IR/ROXICODONE) immediate release tablet 5-10 mg  5-10 mg Oral Q4H PRN Raeghan Demeter F, MD      . pantoprazole (PROTONIX) injection 40 mg  40 mg Intravenous QPM Valdis Bevill F, MD      . piperacillin-tazobactam (  ZOSYN) IVPB 3.375 g  3.375 g Intravenous Q8H Oney Folz F, MD      . prochlorperazine (COMPAZINE) tablet 10 mg  10 mg Oral Q6H PRN Iley Breeden F, MD       Or  . prochlorperazine (COMPAZINE) injection 5-10 mg  5-10 mg Intravenous Q6H PRN Shelle Galdamez, Merri Ray, MD       Current Outpatient Medications  Medication Sig Dispense Refill  . D3-50 50000 units capsule Take 1 capsule by mouth once a week. (Tuesdays)  3  . enalapril (VASOTEC) 2.5 MG tablet Take 2.5 mg by mouth daily.    . fluticasone (FLONASE) 50 MCG/ACT nasal spray Place 1 spray into both nostrils daily.    Marland Kitchen glimepiride (AMARYL) 4 MG tablet Take 4 mg by mouth daily.   6  . hydrOXYzine (ATARAX/VISTARIL) 25 MG tablet Take 1 tablet by mouth daily as needed.   3  . loratadine (CLARITIN) 10 MG tablet Take 10 mg by mouth daily.    .  metFORMIN (GLUCOPHAGE) 1000 MG tablet Take 1,000 mg by mouth 2 (two) times daily.    . metoprolol tartrate (LOPRESSOR) 100 MG tablet Take 100 mg by mouth daily.   0  . OZEMPIC, 1 MG/DOSE, 4 MG/3ML SOPN Inject 1 mg into the skin once a week. (Tuesday)    . PROAIR HFA 108 (90 Base) MCG/ACT inhaler Inhale 1-2 puffs into the lungs every 6 (six) hours as needed.   1  . rosuvastatin (CRESTOR) 20 MG tablet Take 20 mg by mouth daily.    Marland Kitchen ACCU-CHEK AVIVA PLUS test strip   6  . citalopram (CELEXA) 20 MG tablet Take 20 mg by mouth daily. (Patient not taking: Reported on 05/22/2020)    . pantoprazole (PROTONIX) 40 MG tablet Take 1 tablet by mouth 1 day or 1 dose. (Patient not taking: Reported on 05/22/2020)  6     Review of Systems Full ROS  was asked and was negative except for the information on the HPI  Physical Exam Blood pressure 139/88, pulse 61, temperature 97.9 F (36.6 C), temperature source Oral, resp. rate 18, SpO2 100 %. CONSTITUTIONAL: Patient is anxious EYES: Pupils are equal, round, and reactive to light, Sclera are non-icteric. EARS, NOSE, MOUTH AND THROAT: The oropharynx is clear. The oral mucosa is pink and moist. Hearing is intact to voice. LYMPH NODES:  Lymph nodes in the neck are normal. RESPIRATORY:  Lungs are clear. There is normal respiratory effort, with equal breath sounds bilaterally, and without pathologic use of accessory muscles. CARDIOVASCULAR: Heart is regular without murmurs, gallops, or rubs. GI: The abdomen is soft, tender to palpation in the right upper quadrant with positive Murphy sign.  No evidence of peritonitis.  No major scars.  No masses or megalies  GU: Rectal deferred.   MUSCULOSKELETAL: Normal muscle strength and tone. No cyanosis or edema.   SKIN: Turgor is good and there are no pathologic skin lesions or ulcers. NEUROLOGIC: Motor and sensation is grossly normal. Cranial nerves are grossly intact. PSYCH:  Oriented to person, place and time. Affect is  normal.  Data Reviewed  I have personally reviewed the patient's imaging, laboratory findings and medical records.    Assessment/Plan 44 year old female with clinical presentation consistent with acute cholecystitis confirmed by MRCP and ultrasound.  There is no evidence of choledocholithiasis despite elevation of the LFTs.  Discussed with patient in detail and I do recommend robotic cholecystectomy.I discussed the procedure in detail.  The patient was given Agricultural engineer.  We discussed the risks and benefits of a laparoscopic cholecystectomy and possible cholangiogram including, but not limited to bleeding, infection, injury to surrounding structures such as the intestine or liver, bile leak, retained gallstones, need to convert to an open procedure, prolonged diarrhea, blood clots such as  DVT, common bile duct injury, anesthesia risks, and possible need for additional procedures.  The likelihood of improvement in symptoms and return to the patient's normal status is good. We discussed the typical post-operative recovery course. We will start IV fluids, IV antibiotics keep n.p.o. and I have already discussed with the OR and we should go this afternoon.   Sterling Big, MD FACS General Surgeon 05/22/2020, 7:29 AM

## 2020-05-22 NOTE — Transfer of Care (Signed)
Immediate Anesthesia Transfer of Care Note  Patient: Ashley Mullen  Procedure(s) Performed: XI ROBOTIC ASSISTED LAPAROSCOPIC CHOLECYSTECTOMY (N/A ) INDOCYANINE GREEN FLUORESCENCE IMAGING (ICG)  Patient Location: PACU  Anesthesia Type:General  Level of Consciousness: drowsy  Airway & Oxygen Therapy: Patient Spontanous Breathing and Patient connected to face mask oxygen  Post-op Assessment: Report given to RN  Post vital signs: stable  Last Vitals:  Vitals Value Taken Time  BP 156/78 05/22/20 1625  Temp    Pulse 95 05/22/20 1627  Resp 21 05/22/20 1627  SpO2 100 % 05/22/20 1627  Vitals shown include unvalidated device data.  Last Pain:  Vitals:   05/22/20 1344  TempSrc: Tympanic  PainSc: 0-No pain         Complications: No complications documented.

## 2020-05-22 NOTE — Anesthesia Procedure Notes (Signed)
Procedure Name: Intubation Date/Time: 05/22/2020 3:03 PM Performed by: Jaye Beagle, CRNA Pre-anesthesia Checklist: Patient identified, Emergency Drugs available, Suction available, Patient being monitored and Timeout performed Patient Re-evaluated:Patient Re-evaluated prior to induction Oxygen Delivery Method: Circle system utilized Preoxygenation: Pre-oxygenation with 100% oxygen Induction Type: IV induction Ventilation: Mask ventilation without difficulty Laryngoscope Size: McGraph and 3 Grade View: Grade I Tube type: Oral Tube size: 7.0 mm Number of attempts: 1 Airway Equipment and Method: Stylet Placement Confirmation: ETT inserted through vocal cords under direct vision Secured at: 22 cm Tube secured with: Tape Dental Injury: Teeth and Oropharynx as per pre-operative assessment

## 2020-05-22 NOTE — Anesthesia Postprocedure Evaluation (Signed)
Anesthesia Post Note  Patient: Shamecca Watson  Procedure(s) Performed: XI ROBOTIC ASSISTED LAPAROSCOPIC CHOLECYSTECTOMY (N/A ) INDOCYANINE GREEN FLUORESCENCE IMAGING (ICG)  Patient location during evaluation: PACU Anesthesia Type: General Level of consciousness: awake and alert Pain management: pain level controlled Vital Signs Assessment: post-procedure vital signs reviewed and stable Respiratory status: spontaneous breathing, nonlabored ventilation, respiratory function stable and patient connected to nasal cannula oxygen Cardiovascular status: blood pressure returned to baseline and stable Postop Assessment: no apparent nausea or vomiting Anesthetic complications: no   No complications documented.   Last Vitals:  Vitals:   05/22/20 1655 05/22/20 1710  BP: 140/79 139/78  Pulse: 92 90  Resp: (!) 25 18  Temp:  37 C  SpO2: 100% 100%    Last Pain:  Vitals:   05/22/20 1710  TempSrc:   PainSc: 3                  Corinda Gubler

## 2020-05-22 NOTE — ED Notes (Signed)
Iv started, meds given.  U/s in with pt now

## 2020-05-22 NOTE — ED Notes (Signed)
Report off to [place] rn  

## 2020-05-22 NOTE — ED Notes (Signed)
Pt reports chest pain in center of chest and below breast.  No n/v/d  Sx began today.  Pt was brought in via ems from home.  Family with pt.

## 2020-05-22 NOTE — ED Notes (Signed)
LR switched from right AC to left forearm for easier flow

## 2020-05-22 NOTE — ED Notes (Signed)
Pt noted to have an episode of vomiting. Pt states she fills better and will let staff know if she needs nausea medication.  Hand off of care and report given to [employer], [place]

## 2020-05-22 NOTE — Op Note (Signed)
Robotic assisted laparoscopic Cholecystectomy  Pre-operative Diagnosis: choelcystitis  Post-operative Diagnosis: same  Procedure:  Robotic assisted laparoscopic Cholecystectomy  Surgeon: Sterling Big, MD FACS  Anesthesia: Gen. with endotracheal tube  Findings:  Cholecystitis   Estimated Blood Loss: 5cc       Specimens: Gallbladder           Complications: none   Procedure Details  The patient was seen again in the Holding Room. The benefits, complications, treatment options, and expected outcomes were discussed with the patient. The risks of bleeding, infection, recurrence of symptoms, failure to resolve symptoms, bile duct damage, bile duct leak, retained common bile duct stone, bowel injury, any of which could require further surgery and/or ERCP, stent, or papillotomy were reviewed with the patient. The likelihood of improving the patient's symptoms with return to their baseline status is good.  The patient and/or family concurred with the proposed plan, giving informed consent.  The patient was taken to Operating Room, identified  and the procedure verified as Laparoscopic Cholecystectomy.  A Time Out was held and the above information confirmed.  Prior to the induction of general anesthesia, antibiotic prophylaxis was administered. VTE prophylaxis was in place. General endotracheal anesthesia was then administered and tolerated well. After the induction, the abdomen was prepped with Chloraprep and draped in the sterile fashion. The patient was positioned in the supine position.  Cut down technique was used to enter the abdominal cavity and a Hasson trochar was placed after two vicryl stitches were anchored to the fascia. Pneumoperitoneum was then created with CO2 and tolerated well without any adverse changes in the patient's vital signs.  Three 8-mm ports were placed under direct vision. All skin incisions  were infiltrated with a local anesthetic agent before making the incision and  placing the trocars.   The patient was positioned  in reverse Trendelenburg, robot was brought to the surgical field and docked in the standard fashion.  We made sure all the instrumentation was kept indirect view at all times and that there were no collision between the arms. I scrubbed out and went to the console.  The gallbladder was identified, the fundus grasped and retracted cephalad. Adhesions were lysed bluntly. The infundibulum was grasped and retracted laterally, exposing the peritoneum overlying the triangle of Calot. This was then divided and exposed in a blunt fashion. An extended critical view of the cystic duct and cystic artery was obtained.  The cystic duct was clearly identified and bluntly dissected.   Artery and duct were double clipped and divided. Using ICG cholangiography we visualize the cystic duct and so no a Baron biliary ductal anatomy or evidence of bile injuries. The gallbladder was taken from the gallbladder fossa in a retrograde fashion with the electrocautery.  Hemostasis was achieved with the electrocautery. nspection of the right upper quadrant was performed. No bleeding, bile duct injury or leak, or bowel injury was noted. Robotic instruments and robotic arms were undocked in the standard fashion.  I scrubbed back in.  The gallbladder was removed and placed in an Endocatch bag.   Pneumoperitoneum was released.  The periumbilical port site was closed with interrumpted 0 Vicryl sutures. 4-0 subcuticular Monocryl was used to close the skin. Dermabond was  applied.  The patient was then extubated and brought to the recovery room in stable condition. Sponge, lap, and needle counts were correct at closure and at the conclusion of the case.  Caroleen Hamman, MD, FACS

## 2020-05-22 NOTE — Anesthesia Preprocedure Evaluation (Addendum)
Anesthesia Evaluation  Patient identified by MRN, date of birth, ID band Patient awake    Reviewed: Allergy & Precautions, H&P , NPO status , Patient's Chart, lab work & pertinent test results, reviewed documented beta blocker date and time   History of Anesthesia Complications Negative for: history of anesthetic complications  Airway Mallampati: II  TM Distance: >3 FB Neck ROM: full    Dental  (+) Dental Advidsory Given, Missing, Teeth Intact   Pulmonary neg shortness of breath, asthma , neg sleep apnea, neg recent URI,    Pulmonary exam normal breath sounds clear to auscultation       Cardiovascular Exercise Tolerance: Good hypertension, (-) angina(-) Past MI and (-) Cardiac Stents Normal cardiovascular exam(-) dysrhythmias (-) Valvular Problems/Murmurs Rhythm:regular Rate:Normal     Neuro/Psych PSYCHIATRIC DISORDERS Depression negative neurological ROS     GI/Hepatic Neg liver ROS, GERD  ,  Endo/Other  diabetes  Renal/GU negative Renal ROS  negative genitourinary   Musculoskeletal   Abdominal   Peds  Hematology negative hematology ROS (+)   Anesthesia Other Findings Past Medical History: No date: Depression No date: Diabetes mellitus without complication (HCC) No date: GERD (gastroesophageal reflux disease) No date: Hyperlipidemia No date: Hypertension   Reproductive/Obstetrics negative OB ROS                            Anesthesia Physical Anesthesia Plan  ASA: III  Anesthesia Plan: General   Post-op Pain Management:    Induction: Intravenous  PONV Risk Score and Plan: 3 and Ondansetron, Dexamethasone, Midazolam and Treatment may vary due to age or medical condition  Airway Management Planned: Oral ETT  Additional Equipment:   Intra-op Plan:   Post-operative Plan: Extubation in OR  Informed Consent: I have reviewed the patients History and Physical, chart, labs and  discussed the procedure including the risks, benefits and alternatives for the proposed anesthesia with the patient or authorized representative who has indicated his/her understanding and acceptance.     Dental Advisory Given  Plan Discussed with: Anesthesiologist, CRNA and Surgeon  Anesthesia Plan Comments:         Anesthesia Quick Evaluation

## 2020-05-22 NOTE — ED Notes (Signed)
This RN took over primary care of pt. Pt taken to MRI at this time via stretcher with MRI tech. Pt states feeling better, but has pain to the middle lower chest and mid upper abdomen.

## 2020-05-22 NOTE — ED Provider Notes (Signed)
[facility] Emergency Department Provider Note  ____________________________________________  Time seen: Approximately 1:31 AM  I have reviewed the triage vital signs and the nursing notes.   HISTORY  Chief Complaint Chest Pain   HPI Ashley Mullen is a 44 y.o. female with history of diabetes, GERD, hypertension and hyperlipidemia who presents for evaluation of chest pain.  Patient reports that the pain is in the lower chest and epigastric region.  Stabbing, constant and severe.  The pain does not radiate.  The pain is worse when she lays down and better when she leans forward.  Patient reports that the pain started after she had fried chicken this evening.  No nausea or vomiting, no shortness of breath, no fever or chills, no diarrhea or constipation.  Patient denies ever having similar pain.  She has had a tubal ligation but no other abdominal surgeries.  No personal family history of heart disease, PE or DVT, recent travel immobilization, leg pain or swelling, hemoptysis or exogenous hormones.  Patient denies any recent illnesses other than some sinus congestion over the last several weeks.   Patient has been vaccinated for Covid.  Patient denies alcohol use, history of ulcers but does report that she takes NSAIDs for migraine headaches.  She has been taking more than normal over the last couple of days.  Past Medical History:  Diagnosis Date  . Depression   . Diabetes mellitus without complication (HCC)   . GERD (gastroesophageal reflux disease)   . Hyperlipidemia   . Hypertension     There are no problems to display for this patient.   Past Surgical History:  Procedure Laterality Date  . TUBAL LIGATION  2011    Prior to Admission medications   Medication Sig Start Date End Date Taking? Authorizing Provider  ACCU-CHEK AVIVA PLUS test strip  03/11/18   [provider]  citalopram (CELEXA) 20 MG tablet Take 20 mg by mouth daily.    [provider]  D3-50 50000 units capsule Take 1 capsule by mouth 1 day or 1 dose. 01/20/18   [provider]  enalapril (VASOTEC) 2.5 MG tablet Take 2.5 mg by mouth daily.    [provider]  glimepiride (AMARYL) 4 MG tablet Take 1 tablet by mouth 1 day or 1 dose. 01/22/18   [provider]  hydrOXYzine (ATARAX/VISTARIL) 25 MG tablet Take 1 tablet by mouth 1 day or 1 dose. 02/11/18   [provider]  metFORMIN (GLUMETZA) 1000 MG (MOD) 24 hr tablet Take 1,000 mg by mouth daily with breakfast.    [provider]  metoprolol tartrate (LOPRESSOR) 100 MG tablet Take 1 tablet by mouth 1 day or 1 dose. 02/04/18   [provider]  pantoprazole (PROTONIX) 40 MG tablet Take 1 tablet by mouth 1 day or 1 dose. 02/11/18   [provider]  PROAIR HFA [phone] Base) MCG/ACT inhaler  03/11/18   [provider]  rosuvastatin (CRESTOR) 20 MG tablet Take 20 mg by mouth daily.    [provider]    Allergies Patient has no known allergies.  Family History  Problem Relation Age of Onset  . Breast cancer Mother        27's  . Bladder Cancer Father   . Lung cancer Maternal Grandmother   . Breast cancer Paternal Aunt     Social History Social History   Tobacco Use  . Smoking status: Never Smoker  . Smokeless tobacco: Never Used  Substance Use Topics  . Alcohol use: Yes    Alcohol/week: 2.0 standard drinks    Types: 1 Cans of beer, 1 Glasses of wine per week    Comment: 1-2  . Drug use: Never    Review of Systems  Constitutional: Negative for fever. Eyes: Negative for visual changes. ENT: Negative for sore throat. Neck: No neck pain  Cardiovascular: + chest pain. Respiratory: Negative for shortness of breath. Gastrointestinal: + upper abdominal pain. No vomiting or diarrhea. Genitourinary: Negative for dysuria. Musculoskeletal: Negative for back pain. Skin: Negative for rash. Neurological: Negative for headaches,  weakness or numbness. Psych: No SI or HI  ____________________________________________   PHYSICAL EXAM:  VITAL SIGNS: ED Triage Vitals [05/21/20 2125]  Enc Vitals Group     BP (!) 150/79     Pulse Rate 85     Resp 17     Temp 98.6 F (37 C)     Temp Source Oral     SpO2 100 %     Weight      Height      Head Circumference      Peak Flow      Pain Score      Pain Loc      Pain Edu?      Excl. in GC?     Constitutional: Alert and oriented, looks uncomfortable due to pain.  HEENT:      Head: Normocephalic and atraumatic.         Eyes: Conjunctivae are normal. Sclera is non-icteric.       Mouth/Throat: Mucous membranes are moist.       Neck: Supple with no signs of meningismus. Cardiovascular: Regular rate and rhythm. No murmurs, gallops, or rubs. 2+ symmetrical distal pulses are present in all extremities. No JVD. Respiratory: Normal respiratory effort. Lungs are clear to auscultation bilaterally. No wheezes, crackles, or rhonchi.  Gastrointestinal: Soft, patient is tender to palpation in the epigastric and right upper quadrant with positive Murphy sign, no distention. Musculoskeletal: No edema, cyanosis, or erythema of extremities. Neurologic: Normal speech and language. Face is symmetric. Moving all extremities. No gross focal neurologic deficits are appreciated. Skin: Skin is warm, dry and intact. No rash noted. Psychiatric: Mood and affect are normal. Speech and behavior are normal.  ____________________________________________   LABS (all labs ordered are listed, but only abnormal results are displayed)  Labs Reviewed  BASIC METABOLIC PANEL - Abnormal; Notable for the following components:      Result Value   Potassium 3.0 (*)    Glucose, Bld 139 (*)    All other components within normal limits  HEPATIC FUNCTION PANEL - Abnormal; Notable for the following components:   AST 840 (*)    ALT 239 (*)    Alkaline Phosphatase 136 (*)    Bilirubin, Direct 0.3 (*)      All other components within normal limits  SARS CORONAVIRUS 2 BY RT PCR (HOSPITAL ORDER, PERFORMED IN [facility])  CBC  LIPASE, BLOOD  POC URINE PREG, ED  TROPONIN I (HIGH SENSITIVITY)  TROPONIN I (HIGH SENSITIVITY)   ____________________________________________  EKG  ED ECG REPORT I, Nita Sicklearolina Tavionna Grout, the attending physician, personally viewed and interpreted this ECG.  Normal sinus rhythm, rate of 83, normal intervals, normal axis, no ST elevations or depressions, Q waves in leads III and V3.  No prior for comparison ____________________________________________  RADIOLOGY  I have personally reviewed the images performed during this visit and I agree with the  Radiologist's read.   Interpretation by Radiologist:  DG Chest 2 View  Result Date: 05/21/2020 CLINICAL DATA:  Chest pain. EXAM: CHEST - 2 VIEW COMPARISON:  CT chest 02/13/2020 FINDINGS: Heart size within normal limits. There is no appreciable airspace consolidation or pulmonary edema. No evidence of pleural effusion or pneumothorax. No acute bony abnormality identified. IMPRESSION: No evidence of acute cardiopulmonary abnormality. Electronically Signed   By: Jackey Loge DO   On: 05/21/2020 21:54   US ABDOMEN LIMITED RUQ  Result Date: 05/22/2020 CLINICAL DATA:  Pain EXAM: ULTRASOUND ABDOMEN LIMITED RIGHT UPPER QUADRANT COMPARISON:  None. FINDINGS: Gallbladder: There is cholelithiasis with gallbladder wall thickening. The gallbladder wall measures up to approximately 4.4 mm. There is trace pericholecystic free fluid. The gallbladder is not significantly distended. The sonographic Eulah Pont sign cannot be adequately assessed secondary to the patient's condition. Common bile duct: Diameter: 4.5 mm. Liver: No focal lesion identified. Within normal limits in parenchymal echogenicity. Portal vein is patent on color Doppler imaging with normal direction of blood flow towards the liver. Other: None. IMPRESSION:  Cholelithiasis with sonographic findings equivocal for acute cholecystitis. The sonographic Murphy sign cannot be adequately assessed. Correlation with laboratory studies is recommended. If there is high clinical suspicion for acute cholecystitis, consider HIDA scan and/or surgical consultation. Electronically Signed   By: Katherine Mantle M.D.   On: 05/22/2020 02:15     ____________________________________________   PROCEDURES  Procedure(s) performed:yes .1-3 Lead EKG Interpretation Performed by: Nita Sickle, MD Authorized by: Nita Sickle, MD     Interpretation: non-specific     ECG rate assessment: normal     Rhythm: sinus rhythm     Ectopy: none     Critical Care performed:  None ____________________________________________   INITIAL IMPRESSION / ASSESSMENT AND PLAN / ED COURSE  44 y.o. female with history of diabetes, GERD, hypertension and hyperlipidemia who presents for evaluation of lower chest pain/epigastric sharp and constant pain that started after eating fried chicken this evening.  Patient looks uncomfortable due to the pain, her vitals are within normal limits, she has normal work of breathing, normal sats, lungs are clear to auscultation, heart regular rate and rhythm, abdomen is soft with epigastric and right upper quadrant tenderness and positive Murphy sign.  No distention.  Differential diagnoses including cholelithiasis versus cholecystitis versus pancreatitis versus peptic ulcer versus gastritis versus GERD.  PE was considered although less likely especially since patient is PERC negative, has no tachypnea, tachycardia, hypoxia.  Atypical for ACS but EKG was done and 2 high-sensitivity troponins for further stratification.  EKG shows no signs of acute ischemia.  Pericarditis considered as well since patient reports that the pain is better when she leans forward.  However EKG and troponin are normal.  Chest x-ray showing no infiltrates, no pneumothorax,  normal mediastinum silhouette, confirmed by radiology.  We will get a right upper quadrant ultrasound, labs including LFTs and lipase, will treat her pain with IV fentanyl, IV Pepcid and Zofran.  History gathered from patient her boyfriend was at bedside.  Discussed care with both of them.  Old medical records reviewed.  Patient placed on telemetry for close monitoring.  _________________________ 2:33 AM on 05/22/2020 -----------------------------------------  Ultrasound consistent with cholecystitis.  Labs showing transaminitis with AST of 840, ALT of 239 but normal bilirubin.  Lipase still pending but according to lab it is very elevated.  Normal white count.  Discussed with Dr. Everlene Farrier from surgery who recommend an MRCP.  Common bile duct is normal with  no evidence of retained stone in the common bile duct.  Will cover with IV antibiotics to prevent any ascending infection and get the MRCP.  Discussed needs for surgery and admission with patient who is in agreement.    _____________________________________________ Please note:  Patient was evaluated in Emergency Department today for the symptoms described in the history of present illness. Patient was evaluated in the context of the global COVID-19 pandemic, which necessitated consideration that the patient might be at risk for infection with the SARS-CoV-2 virus that causes COVID-19. Institutional protocols and algorithms that pertain to the evaluation of patients at risk for COVID-19 are in a state of rapid change based on information released by regulatory bodies including the CDC and federal and state organizations. These policies and algorithms were followed during the patient's care in the ED.  Some ED evaluations and interventions may be delayed as a result of limited staffing during the pandemic.   [place] Controlled Substance Database was reviewed by me. ____________________________________________   FINAL CLINICAL IMPRESSION(S) / ED  DIAGNOSES   Final diagnoses:  RUQ abdominal pain  Cholecystitis  Gallstone pancreatitis  Transaminitis      NEW MEDICATIONS STARTED DURING THIS VISIT:  ED Discharge Orders    None       Note:  This document was prepared using Dragon voice recognition software and may include unintentional dictation errors.    Don Perking, [place], MD 05/22/20 [phone]

## 2020-05-23 LAB — COMPREHENSIVE METABOLIC PANEL
ALT: 277 U/L — ABNORMAL HIGH (ref 0–44)
AST: 179 U/L — ABNORMAL HIGH (ref 15–41)
Albumin: 3.5 g/dL (ref 3.5–5.0)
Alkaline Phosphatase: 121 U/L (ref 38–126)
Anion gap: 8 (ref 5–15)
BUN: <5 mg/dL — ABNORMAL LOW (ref 6–20)
CO2: 24 mmol/L (ref 22–32)
Calcium: 8.5 mg/dL — ABNORMAL LOW (ref 8.9–10.3)
Chloride: 108 mmol/L (ref 98–111)
Creatinine, Ser: 0.56 mg/dL (ref 0.44–1.00)
GFR calc Af Amer: >60 mL/min (ref >60)
GFR calc non Af Amer: >60 mL/min (ref >60)
Glucose, Bld: 117 mg/dL — ABNORMAL HIGH (ref 70–99)
Potassium: 3.1 mmol/L — ABNORMAL LOW (ref 3.5–5.1)
Sodium: 140 mmol/L (ref 135–145)
Total Bilirubin: 0.9 mg/dL (ref 0.3–1.2)
Total Protein: 6.5 g/dL (ref 6.5–8.1)

## 2020-05-23 MED ORDER — IBUPROFEN 800 MG PO TABS
800.0000 mg | ORAL_TABLET | Freq: Three times a day (TID) | ORAL | 0 refills | Status: DC | PRN
Start: 2020-05-23 — End: 2020-05-23

## 2020-05-23 MED ORDER — POTASSIUM CHLORIDE CRYS ER 20 MEQ PO TBCR
20.0000 meq | EXTENDED_RELEASE_TABLET | Freq: Two times a day (BID) | ORAL | Status: DC
  Administered 2020-05-23: 20 meq via ORAL
  Filled 2020-05-23: qty 1

## 2020-05-23 MED ORDER — IBUPROFEN 800 MG PO TABS
800.0000 mg | ORAL_TABLET | Freq: Three times a day (TID) | ORAL | 0 refills | Status: DC | PRN
Start: 2020-05-23 — End: 2020-11-26

## 2020-05-23 MED ORDER — OXYCODONE HCL 5 MG PO TABS: 5.0000 mg | ORAL_TABLET | Freq: Four times a day (QID) | ORAL | 0 refills | Status: DC | PRN

## 2020-05-23 NOTE — Discharge Instructions (Signed)
In addition to included general post-operative instructions for laparoscopic cholecystectomy,  Diet: Resume home diet. You may (or may not) notice looser stools with fatty meals after surgery for a few days, this is normal as your body is adjusting to not having a gallbladder.   Activity: No heavy lifting >20 pounds (children, pets, laundry, garbage) for 4 weeks, but light activity and walking are encouraged. Do not drive or drink alcohol if taking narcotic pain medications or having pain that might distract from driving.  Wound care: You may shower/get incision wet with soapy water and pat dry (do not rub incisions), but no baths or submerging incision underwater until follow-up.   Medications: Resume all home medications. For mild to moderate pain: acetaminophen (Tylenol) or ibuprofen/naproxen (if no kidney disease). Combining Tylenol with alcohol can substantially increase your risk of causing liver disease. Narcotic pain medications, if prescribed, can be used for severe pain, though may cause nausea, constipation, and drowsiness. Do not combine Tylenol and Percocet (or similar) within a 6 hour period as Percocet (and similar) contain(s) Tylenol. If you do not need the narcotic pain medication, you do not need to fill the prescription.  Call office [phone] / [phone]) at any time if any questions, worsening pain, fevers/chills, bleeding, drainage from incision site, or other concerns.

## 2020-05-23 NOTE — Plan of Care (Signed)
Discharge order received. Patient mental status is at baseline. Vital signs stable . No signs of acute distress. Discharge instructions given. Patient verbalized understanding. No other issues noted at this time.   

## 2020-05-23 NOTE — Discharge Summary (Signed)
[facility] SURGICAL DISCHARGE SUMMARY  Patient ID: Ashley Mullen MRN: [id number] DOB/AGE: 10-Apr-1976 44 y.o.  Admit date: 05/22/2020 Discharge date: 05/23/2020  Discharge Diagnoses Patient Active Problem List   Diagnosis Date Noted  . Cholecystitis 05/22/2020    Consultants None  Procedures 05/22/2020:  Robotic assisted laparoscopic cholecystectomy  HPI: Ashley Mullen is a 44 y.o. female with significant history of hypertension and diabetes initially came to the emergency room last night complaining of epigastric pain and lower chest pain.  Patient reports that the pain was sharp constant and moderate to severe in nature.  Apparently she had some dinner and then shortly after developed the pain.  She also has associated nausea and vomiting.  No fevers no chills no evidence of obstructive jaundice.  Note the patient does not have a history of coronary artery disease and here her troponins were completely normal.  Her EKG is also completely normal. She underwent an ultrasound as well as an MRCP that I have personally reviewed there is evidence of gallbladder thickening but no evidence of choledocholithiasis.  There is evidence to suggest cholecystitis.  CBC was reviewed and her hemoglobin is 11.4.  He does have significant elevation of the AST and ALT as well as alkaline phosphatase therefore I requested an MRCP showing no evidence of choledocholithiasis. She is able to perform more than 4 METS of activities and the only abdominal operation was a tubal ligation in the past   Hospital Course: Informed consent was obtained and documented, and patient underwent uneventful robotic assisted laparoscopic cholecystectomy (Dr Everlene Farrier, 05/22/2020).  Post-operatively, patient's pain/symptoms improved/resolved and advancement of patient's diet and ambulation were well-tolerated. The remainder of patient's hospital course was essentially unremarkable, and discharge planning was initiated  accordingly with patient safely able to be discharged home with appropriate discharge instructions, pain control, and outpatient follow-up after all of her questions were answered to her expressed satisfaction.   Discharge Condition: good   Physical Examination:  Constitutional: Well appearing female, NAD Pulmonary: Normal effort, no respiratory distress Gastrointestinal: Soft, incisional soreness, non-distended, no rebound/guarding Skin: Laparoscopic incisions are CDI with dermabond, no erythema or drainage    Allergies as of 05/23/2020   No Known Allergies     Medication List    TAKE these medications   Accu-Chek Aviva Plus test strip Generic drug: glucose blood   citalopram 20 MG tablet Commonly known as: CELEXA Take 20 mg by mouth daily.   D3-50 1.25 MG (50000 UT) capsule Generic drug: Cholecalciferol Take 1 capsule by mouth once a week. (Tuesdays)   enalapril 2.5 MG tablet Commonly known as: VASOTEC Take 2.5 mg by mouth daily.   fluticasone 50 MCG/ACT nasal spray Commonly known as: FLONASE Place 1 spray into both nostrils daily.   glimepiride 4 MG tablet Commonly known as: AMARYL Take 4 mg by mouth daily.   hydrOXYzine 25 MG tablet Commonly known as: ATARAX/VISTARIL Take 1 tablet by mouth daily as needed.   ibuprofen 800 MG tablet Commonly known as: ADVIL Take 1 tablet (800 mg total) by mouth every 8 (eight) hours as needed.   loratadine 10 MG tablet Commonly known as: CLARITIN Take 10 mg by mouth daily.   metFORMIN 1000 MG tablet Commonly known as: GLUCOPHAGE Take 1,000 mg by mouth 2 (two) times daily.   metoprolol tartrate 100 MG tablet Commonly known as: LOPRESSOR Take 100 mg by mouth daily.   oxyCODONE 5 MG immediate release tablet Commonly known as: Oxy IR/ROXICODONE Take 1 tablet (5 mg total)  by mouth every 6 (six) hours as needed for severe pain or breakthrough pain.   Ozempic (1 MG/DOSE) 4 MG/3ML Sopn Generic drug: Semaglutide (1  MG/DOSE) Inject 1 mg into the skin once a week. (Tuesday)   pantoprazole 40 MG tablet Commonly known as: PROTONIX Take 1 tablet by mouth 1 day or 1 dose.   ProAir HFA 108 (90 Base) MCG/ACT inhaler Generic drug: albuterol Inhale 1-2 puffs into the lungs every 6 (six) hours as needed.   rosuvastatin 20 MG tablet Commonly known as: CRESTOR Take 20 mg by mouth daily.         Follow-up Information    Pabon, [place] F, MD. Schedule an appointment as soon as possible for a visit in 2 week(s).   Specialty: General Surgery Why: s/p robotic assisted laparoscopic cholecystectomy  Contact information: [address] [place] [address] [phone]                Time spent on discharge management including discussion of hospital course, clinical condition, outpatient instructions, prescriptions, and follow up with the patient and members of the medical team: >30 minutes  -- Lynden Oxford , PA-C [facility]  05/23/2020, 9:12 AM [phone] M-F: 7am - 4pm

## 2020-05-24 LAB — SURGICAL PATHOLOGY

## 2020-06-06 ENCOUNTER — Encounter: Payer: Self-pay | Admitting: Surgery

## 2020-06-06 ENCOUNTER — Ambulatory Visit (INDEPENDENT_AMBULATORY_CARE_PROVIDER_SITE_OTHER): Payer: Self-pay | Admitting: Surgery

## 2020-06-06 ENCOUNTER — Other Ambulatory Visit: Payer: Self-pay

## 2020-06-06 VITALS — BP 134/83 | HR 80 | Temp 98.8°F | Resp 12 | Ht 69.0 in | Wt 225.0 lb

## 2020-06-06 DIAGNOSIS — Z09 Encounter for follow-up examination after completed treatment for conditions other than malignant neoplasm: Secondary | ICD-10-CM

## 2020-06-06 NOTE — Patient Instructions (Signed)

## 2020-06-07 NOTE — Progress Notes (Signed)
Status post robotic cholecystectomy doing well without any surgical issues  Physical exam: No acute distress Abdomen: Soft nontender incisions healing well without evidence of infection.  There is a resolving ecchymosis from local anesthetic.   A/P doing well without complications.  RTC as needed

## 2020-09-20 ENCOUNTER — Other Ambulatory Visit: Payer: Self-pay | Admitting: Family

## 2020-09-20 DIAGNOSIS — N941 Unspecified dyspareunia: Secondary | ICD-10-CM

## 2020-09-20 DIAGNOSIS — N946 Dysmenorrhea, unspecified: Secondary | ICD-10-CM

## 2020-10-01 ENCOUNTER — Ambulatory Visit
Admission: RE | Admit: 2020-10-01 | Discharge: 2020-10-01 | Disposition: A | Discharge status: Home / Self Care | Payer: Medicaid Other | Source: Ambulatory Visit | Attending: Family | Admitting: Family

## 2020-10-01 ENCOUNTER — Other Ambulatory Visit: Payer: Self-pay

## 2020-10-01 DIAGNOSIS — N941 Unspecified dyspareunia: Secondary | ICD-10-CM | POA: Diagnosis present

## 2020-10-01 DIAGNOSIS — N946 Dysmenorrhea, unspecified: Secondary | ICD-10-CM | POA: Diagnosis not present

## 2020-11-13 ENCOUNTER — Other Ambulatory Visit: Payer: Medicaid Other

## 2020-11-26 ENCOUNTER — Other Ambulatory Visit: Payer: Self-pay | Admitting: Obstetrics and Gynecology

## 2020-11-26 NOTE — H&P (Signed)
Progress Notes by Meloni Hinz, Sabas Sous, MD at 11/07/2020 10:45 AM  Author: Arnetia Bronk, Sabas Sous, MD Service: - Author Type: Physician  Filed: 11/07/2020 12:00 PM Encounter Date: 11/07/2020 Note Type: Progress Notes  Editor: Lavetta Geier, Sabas Sous, MD (Physician)       Show:Clear all [x] Manual[x] Template[] Copied  Added by: [x] Nhung Danko, , MD   [] Hover for details   Chief Complaint:    Ashley Mullen is a 45 y.o. female here for [facility] and bil;ateral salpingectomy  fpainful cycles and pain with  intercourse . Dorann Ou Pain with sex , insertinal and deep thrust . Menses monthly 5-7 day and passes clots .  H/O  LEEP 2014 and last pap not sure . U/s 11/ 21 with small fibroids 1x1 cm - two   G3P3 EMBX negative    Past Medical History:  has a past medical history of Aortic regurgitation, CHF (congestive heart failure) (CMS-HCC), Coronary artery disease, Diabetes mellitus type 2, uncomplicated (CMS-HCC), Hypertension, Migraine, Mitral regurgitation, and Pulmonary nodule.  Past Surgical History:  has a past surgical history that includes Laparoscopic tubal ligation (Bilateral) and Cervical biopsy w/ loop electrode excision (2014). Family History: family history includes Coronary Artery Disease (Blocked arteries around heart) in her maternal grandmother. Social History:  reports that she has never smoked. She has never used smokeless tobacco. She reports previous alcohol use. She reports that she does not use drugs. OB/GYN History:          OB History    Gravida  3   Para  3   Term      Preterm      AB      Living  3     SAB      IAB      Ectopic      Molar      Multiple      Live Births  3          Allergies: has No Known Allergies. Medications:  Current Outpatient Medications:  .  ACCU-CHEK AVIVA PLUS TEST STRP test strip, USE STRIP TO CHECK GLUCOSE THREE TIMES DAILY, Disp: , Rfl:  .  cholecalciferol, vitamin D3, 50,000  unit capsule, , Disp: , Rfl: 3 .  citalopram (CELEXA) 20 MG tablet, , Disp: , Rfl: 6 .  dapagliflozin (FARXIGA) 10 mg tablet, Take 10 mg by mouth every morning, Disp: , Rfl:  .  enalapril (VASOTEC) 2.5 MG tablet, , Disp: , Rfl: 3 .  fluticasone propionate (FLONASE) 50 mcg/actuation nasal spray, Place 1 spray into both nostrils once daily USE 1 SPRAY IN EACH NOSTRIL ONCE DAILY, Disp: , Rfl:  .  hydrOXYzine (ATARAX) 25 MG tablet, , Disp: , Rfl: 3 .  loratadine (CLARITIN) 10 mg tablet, Take 10 mg by mouth once daily, Disp: , Rfl: 6 .  metFORMIN (GLUCOPHAGE) 1000 MG tablet, , Disp: , Rfl: 6 .  mupirocin (BACTROBAN) 2 % ointment, APPLY A SMALL AMOUNT TO EACH NARE TWICE DAILY, Disp: , Rfl:  .  pantoprazole (PROTONIX) 40 MG DR tablet, , Disp: , Rfl: 6 .  PROAIR HFA 90 mcg/actuation inhaler, Inhale 2 inhalations into the lungs every 6 (six) hours as needed, Disp: , Rfl: 1 .  rosuvastatin (CRESTOR) 20 MG tablet, Take 20 mg by mouth once daily TAKE 1 TABLET BY MOUTH ONCE DAILY, Disp: , Rfl:  .  busPIRone (BUSPAR) 7.5 MG tablet, Take 7.5 mg by mouth 2 (two) times daily TAKE 1 TABLET BY MOUTH TWICE DAILY (Patient not  taking: Reported on 11/07/2020  ), Disp: , Rfl:  .  fluconazole (DIFLUCAN) 150 MG tablet, Take 1 tablet (150 mg total) by mouth once for 1 dose May repeat tablet in 1 week, Disp: 2 tablet, Rfl: 0 .  gabapentin (NEURONTIN) 100 MG capsule, TAKE 1 CAPSULE BY MOUTH EVERY DAY AT BEDTIME (Patient not taking: Reported on 11/07/2020  ), Disp: , Rfl:  .  glimepiride (AMARYL) 4 MG tablet, , Disp: , Rfl: 6 .  meloxicam (MOBIC) 15 MG tablet, TAKE 1 TABLET BY MOUTH ONCE DAILY (Patient not taking: Reported on 11/07/2020), Disp: 30 tablet, Rfl: 0 .  metoprolol tartrate (LOPRESSOR) 100 MG tablet, , Disp: , Rfl: 0 .  metroNIDAZOLE (FLAGYL) 500 MG tablet, Take 1 tablet (500 mg total) by mouth 2 (two) times daily for 7 days, Disp: 14 tablet, Rfl: 0 .  OZEMPIC 1 mg/dose (4 mg/3 mL) PnIj, INJECT 1MG  INTO THE SKIN ONCE  WEEKLY, Disp: , Rfl:   Review of Systems: General:                      No fatigue or weight loss Eyes:                           No vision changes Ears:                            No hearing difficulty Respiratory:                No cough or shortness of breath Pulmonary:                  No asthma or shortness of breath Cardiovascular:           No chest pain, palpitations, dyspnea on exertion Gastrointestinal:          No abdominal bloating, chronic diarrhea, constipations, masses, pain or hematochezia Genitourinary:             No hematuria, dysuria, abnormal vaginal discharge, pelvic pain, Menometrorrhagia Lymphatic:                   No swollen lymph nodes Musculoskeletal:         No muscle weakness Neurologic:                  No extremity weakness, syncope, seizure disorder Psychiatric:                  No history of depression, delusions or suicidal/homicidal ideation    Exam:      Vitals:   11/28/20 1028  BP: 140/82  Pulse: 96    Body mass index is 34.11 kg/m.  WDWN female in NAD   Lungs: CTA  CV : RRR without murmur    Neck:  no thyromegaly Abdomen: soft , no mass, normal active bowel sounds,  non-tender, no rebound tenderness Pelvic: tanner stage 5 ,  External genitalia: vulva /labia no lesions Urethra: no prolapse Vagina: grey d/c  Wet mount : + clue cells , + yeast  Cervix: stenotic cx, no lesions, no cervical motion tenderness   Uterus: normal size shape and contour, non-tender Adnexa: no mass,  non-tender   Rectovaginal:   Impression:   The primary encounter diagnosis was Menorrhagia with regular cycle. Diagnoses of Metrorrhagia, Dyspareunia, female, Leukorrhea, Routine cervical smear, BV (bacterial vaginosis), and Monilial vaginitis were also  pertinent to this visit.    Plan:   I have spoken with the patient regarding treatment options including expectant management, hormonal options, or surgical intervention. After a full discussion  the pt elects to proceed with TVH and bilateral salpingectomy   Benefits and risks to surgery: The proposed benefit of the surgery has been discussed with the patient. The possible risks include, but are not limited to: organ injury to the bowel , bladder, ureters, and major blood vessels and nerves. There is a possibility of additional surgeries resulting from these injuries. There is also the risk of blood transfusion and the need to receive blood products during or after the procedure which may rarely lead to HIV or Hepatitis C infection. There is a risk of developing a deep venous thrombosis or a pulmonary embolism . There is the possibility of wound infection and also anesthetic complications, even the rare possibility of death. The patient understands these risks and wishes to proceed. All questions have been answered and the consent has been signed.             .             .                                                I    No follow-ups on file.  Vilma Prader, MD           t

## 2020-12-07 ENCOUNTER — Other Ambulatory Visit: Admission: RE | Admit: 2020-12-07 | Payer: Medicaid Other | Source: Ambulatory Visit

## 2020-12-11 ENCOUNTER — Other Ambulatory Visit: Payer: Self-pay

## 2020-12-11 ENCOUNTER — Encounter
Admission: RE | Admit: 2020-12-11 | Discharge: 2020-12-11 | Disposition: A | Discharge status: Home / Self Care | Payer: Medicaid Other | Source: Ambulatory Visit | Attending: Obstetrics and Gynecology | Admitting: Obstetrics and Gynecology

## 2020-12-11 HISTORY — DX: Chronic diastolic (congestive) heart failure: I50.32

## 2020-12-11 HISTORY — DX: Unspecified asthma, uncomplicated: J45.909

## 2020-12-11 HISTORY — DX: Anxiety disorder, unspecified: F41.9

## 2020-12-11 HISTORY — DX: Unspecified osteoarthritis, unspecified site: M19.90

## 2020-12-11 NOTE — Patient Instructions (Addendum)
Your procedure is scheduled on:12/14/20- FRIDAY Report to the Registration Desk on the 1st floor of the Medical Mall. To find out your arrival time, please call [phone] between 1PM - 3PM on: 12/13/20- Thursday LABS AND EKG 12/12/20 AT MEDICAL MALL 8 AM MEDICAL [facility]- COVID TEST 12/12/20 8AM TO 1PM  REMEMBER: Instructions that are not followed completely may result in serious medical risk, up to and including death; or upon the discretion of your surgeon and anesthesiologist your surgery may need to be rescheduled.  Do not eat food OR DRINK FLUIDS after midnight the night before surgery.  No gum chewing, lozengers or hard candies.   TAKE THESE MEDICATIONS THE MORNING OF SURGERY WITH A SIP OF WATER, ONLY TAKE MEDICATIONS LISTED: - metoprolol tartrate (LOPRESSOR) 100 MG tablet - citalopram (CELEXA) 20 MG tablet - fluticasone (FLONASE) 50 MCG/ACT nasal spray - loratadine (CLARITIN) 10 MG tablet - pantoprazole (PROTONIX) 40 MG table take one the night before and one on the morning of surgery - helps to prevent nausea after surgery. - hydrOXYzine (ATARAX/VISTARIL) 25 MG tablet MAY TAKE IF NEEDED  Use PROAIR HFA 108 (90 Base) MCG/ACT inhaler on the day of surgery and bring to the hospital.  Stop Metformin 2 days prior to surgery. DO NOT TAKE 02/09, 02/10, AND DO NOT TAKE THE MORNING OF SURGERY..  One week prior to surgery: STOP TAKING 12/11/20 Stop Anti-inflammatories (NSAIDS) such as Advil, Aleve, Ibuprofen, Motrin, Naproxen, Naprosyn and Aspirin based products such as Excedrin, Goodys Powder, BC Powder.  Stop ANY OVER THE COUNTER supplements until after surgery. (However, you may continue taking Vitamin D, Vitamin B, and multivitamin up until the day before surgery.)  No Alcohol for 24 hours before or after surgery.  No Smoking including e-cigarettes for 24 hours prior to surgery.  No chewable tobacco products for at least 6 hours prior to surgery.  No nicotine patches on the day  of surgery.  Do not use any "recreational" drugs for at least a week prior to your surgery.  Please be advised that the combination of cocaine and anesthesia may have negative outcomes, up to and including death. If you test positive for cocaine, your surgery will be cancelled.  On the morning of surgery brush your teeth with toothpaste and water, you may rinse your mouth with mouthwash if you wish. Do not swallow any toothpaste or mouthwash.  Do not wear jewelry, make-up, hairpins, clips or nail polish.  Do not wear lotions, powders, or perfumes.   Do not shave body from the neck down 48 hours prior to surgery just in case you cut yourself which could leave a site for infection.  Also, freshly shaved skin may become irritated if using the CHG soap.  Contact lenses, hearing aids and dentures may not be worn into surgery.  Do not bring valuables to the hospital. [facility] is not responsible for any missing/lost belongings or valuables.   Notify your doctor if there is any change in your medical condition (cold, fever, infection).  Wear comfortable clothing (specific to your surgery type) to the hospital.  Plan for stool softeners for home use; pain medications have a tendency to cause constipation. You can also help prevent constipation by eating foods high in fiber such as fruits and vegetables and drinking plenty of fluids as your diet allows.  After surgery, you can help prevent lung complications by doing breathing exercises.  Take deep breaths and cough every 1-2 hours. Your doctor may order a device called  an Facilities manager to help you take deep breaths. When coughing or sneezing, hold a pillow firmly against your incision with both hands. This is called "splinting." Doing this helps protect your incision. It also decreases belly discomfort.  If you are being admitted to the hospital overnight, leave your suitcase in the car. After surgery it may be brought to your  room.  If you are being discharged the day of surgery, you will not be allowed to drive home. You will need a responsible adult (18 years or older) to drive you home and stay with you that night.   If you are taking public transportation, you will need to have a responsible adult (18 years or older) with you. Please confirm with your physician that it is acceptable to use public transportation.   Please call the Pre-admissions Testing Dept. at [phone] if you have any questions about these instructions.  Visitation Policy:  Patients undergoing a surgery or procedure may have one family member or support person with them as long as that person is not COVID-19 positive or experiencing its symptoms.  That person may remain in the waiting area during the procedure.  Inpatient Visitation:    Visiting hours are 7 a.m. to 8 p.m. Patients will be allowed one visitor. The visitor may change daily. The visitor must pass COVID-19 screenings, use hand sanitizer when entering and exiting the patient's room and wear a mask at all times, including in the patient's room. Patients must also wear a mask when staff or their visitor are in the room. Masking is required regardless of vaccination status. Systemwide, no visitors 17 or younger.Your procedure is scheduled on: Report to the Registration Desk on the 1st floor of the Medical Mall. To find out your arrival time, please call [phone] between 1PM - 3PM on:  REMEMBER: Instructions that are not followed completely may result in serious medical risk, up to and including death; or upon the discretion of your surgeon and anesthesiologist your surgery may need to be rescheduled.

## 2020-12-12 ENCOUNTER — Encounter: Payer: Self-pay | Admitting: Obstetrics and Gynecology

## 2020-12-12 ENCOUNTER — Encounter
Admission: RE | Admit: 2020-12-12 | Discharge: 2020-12-12 | Disposition: A | Discharge status: Home / Self Care | Payer: Medicaid Other | Source: Ambulatory Visit | Attending: Obstetrics and Gynecology | Admitting: Obstetrics and Gynecology

## 2020-12-12 ENCOUNTER — Other Ambulatory Visit: Payer: Self-pay

## 2020-12-12 DIAGNOSIS — Z20822 Contact with and (suspected) exposure to covid-19: Secondary | ICD-10-CM | POA: Diagnosis not present

## 2020-12-12 DIAGNOSIS — I10 Essential (primary) hypertension: Secondary | ICD-10-CM | POA: Diagnosis not present

## 2020-12-12 DIAGNOSIS — Z01818 Encounter for other preprocedural examination: Secondary | ICD-10-CM | POA: Diagnosis present

## 2020-12-12 DIAGNOSIS — E119 Type 2 diabetes mellitus without complications: Secondary | ICD-10-CM | POA: Insufficient documentation

## 2020-12-12 LAB — BASIC METABOLIC PANEL
Anion gap: 9 (ref 5–15)
BUN: 18 mg/dL (ref 6–20)
CO2: 28 mmol/L (ref 22–32)
Calcium: 9 mg/dL (ref 8.9–10.3)
Chloride: 102 mmol/L (ref 98–111)
Creatinine, Ser: 0.58 mg/dL (ref 0.44–1.00)
GFR, Estimated: >60 mL/min (ref >60)
Glucose, Bld: 102 mg/dL — ABNORMAL HIGH (ref 70–99)
Potassium: 3.3 mmol/L — ABNORMAL LOW (ref 3.5–5.1)
Sodium: 139 mmol/L (ref 135–145)

## 2020-12-12 LAB — CBC
HCT: 34.8 % — ABNORMAL LOW (ref 36.0–46.0)
Hemoglobin: 11.2 g/dL — ABNORMAL LOW (ref 12.0–15.0)
MCH: 29.9 pg (ref 26.0–34.0)
MCHC: 32.2 g/dL (ref 30.0–36.0)
MCV: 92.8 fL (ref 80.0–100.0)
Platelets: 238 10*3/uL (ref 150–400)
RBC: 3.75 MIL/uL — ABNORMAL LOW (ref 3.87–5.11)
RDW: 12.7 % (ref 11.5–15.5)
WBC: 5.6 10*3/uL (ref 4.0–10.5)
nRBC: 0 % (ref 0.0–0.2)

## 2020-12-12 LAB — TYPE AND SCREEN
ABO/RH(D): A POS
Antibody Screen: NEGATIVE

## 2020-12-12 LAB — SARS CORONAVIRUS 2 (TAT 6-24 HRS): SARS Coronavirus 2: NEGATIVE

## 2020-12-12 NOTE — Progress Notes (Signed)
[facility] Perioperative Services  Pre-Admission/Anesthesia Testing Clinical Review  Date: 12/12/20  Patient Demographics:  Name: Ashley Mullen DOB:   [birth year] MRN:   [id number]  Planned Surgical Procedure(s):    Case: [id number] Date/Time: 12/14/20 0745   Procedures:      HYSTERECTOMY VAGINAL (N/A )     BILATERAL SALPINGECTOMY (Bilateral )   Anesthesia type: General   Pre-op diagnosis: menorrhagia, dyspareunia   Location: [facility] OR ROOM 05 / [facility] ORS FOR ANESTHESIA GROUP   Surgeons: Schermerhorn, Ihor Austin, MD    NOTE: Available PAT nursing documentation and vital signs have been reviewed. Clinical nursing staff has updated patient's PMH/PSHx, current medication list, and drug allergies/intolerances to ensure comprehensive history available to assist in medical decision making as it pertains to the aforementioned surgical procedure and anticipated anesthetic course.   Clinical Discussion:  Ashley Mullen is a 45 y.o. female who is submitted for pre-surgical anesthesia review and clearance prior to her undergoing the above procedure. Patient has never been a smoker. Pertinent PMH includes: CAD, HFpEF, AV and MV regurgitation, HTN, HLD, T2DM, asthma, lung nodules, GERD (on daily PPI), OA, anxiety, depression.  Patient is followed by cardiology Welton Flakes, MD). She was last seen in the cardiology clinic on 12/06/2018 2010; notes reviewed.  At the time of her clinic visit, patient noted to be doing well overall from a cardiovascular perspective. She denied chest pain, shortness of breath, orthopnea, PND, palpitations, peripheral edema, vertiginous symptoms, and presyncope/syncope.  TTE performed on 12/06/2020 demonstrated normal left ventricular systolic function with mild mitral valve regurgitation; LVEF 58%.  Patient on GDMT for her HTN and HLD diagnoses.  Blood pressure moderately controlled at 142/84 on currently prescribed ACEi and beta-blocker therapies.  Patient is on a  statin for her HLD.  Patient reports that her blood sugars have been well controlled on currently prescribed regimen; last Hgb A1c unavailable for review.  Functional capacity, as defined by DASI, is documented as being >/= 4 METS.  No changes were made to patient's medication regimen.  Patient to follow-up with outpatient cardiology at defined intervals for ongoing evaluation and management.  Patient scheduled to undergo gynecological procedure on 12/14/2020 with Dr. Jennell Corner.  Given patient's past medical history significant for cardiovascular diagnoses, presurgical cardiac clearance was sought by the performing surgeon's office and PAT team. Per cardiology, "this patient is optimized for surgery and may proceed with the planned procedural course with a LOW risk stratification".  This patient is not on daily anticoagulation/antiplatelet therapy.  Patient denies any previous perioperative complications with anesthesia.  Patient underwent a general anesthetic course here (ASA three) on 05/22/2020 with no resulting complications documented.  Vitals with BMI 06/06/2020 05/23/2020 05/23/2020  Height 5\' 9"  - -  Weight 225 lbs - -  BMI 33.21 - -  Systolic 134 130  Diastolic 83 84 83  Pulse 80 87 101    Providers/Specialists:   NOTE: Primary physician provider listed below. Patient may have been seen by APP or partner within same practice.   PROVIDER ROLE / SPECIALTY LAST OV  Schermerhorn, [id number], * OB/GYN (Surgeon)  11/26/2020  11/28/2020, MD Primary Care Provider ???  Margaretann Loveless, MD Cardiology  12/06/2020   Allergies:  Patient has no known allergies.  Current Home Medications:   No current facility-administered medications for this encounter.   . citalopram (CELEXA) 20 MG tablet  . D3-50 50000 units capsule  . enalapril (VASOTEC) 2.5 MG tablet  . fluticasone (FLONASE)  50 MCG/ACT nasal spray  . glimepiride (AMARYL) 4 MG tablet  . hydrOXYzine (ATARAX/VISTARIL) 25 MG  tablet  . ibuprofen (ADVIL) 200 MG tablet  . loratadine (CLARITIN) 10 MG tablet  . metFORMIN (GLUCOPHAGE) 1000 MG tablet  . metoprolol tartrate (LOPRESSOR) 100 MG tablet  . OZEMPIC, 1 MG/DOSE, 4 MG/3ML SOPN  . pantoprazole (PROTONIX) 40 MG tablet  . PROAIR HFA 108 (90 Base) MCG/ACT inhaler  . rosuvastatin (CRESTOR) 20 MG tablet  . ACCU-CHEK AVIVA PLUS test strip   History:   Past Medical History:  Diagnosis Date  . Anxiety   . Aortic valve regurgitation   . Arthritis    OSTEOARTHRITIS  . Asthma   . CAD (coronary artery disease)   . Chronic diastolic heart failure (HCC)   . Depression   . Diabetes mellitus without complication (HCC)   . GERD (gastroesophageal reflux disease)   . Hyperlipidemia   . Hypertension   . Lung nodules   . Mitral regurgitation    Past Surgical History:  Procedure Laterality Date  . CHOLECYSTECTOMY    . TUBAL LIGATION  2011  . WISDOM TOOTH EXTRACTION     Family History  Problem Relation Age of Onset  . Breast cancer Mother        38's  . Bladder Cancer Father   . Lung cancer Maternal Grandmother   . Breast cancer Paternal Aunt    Social History   Tobacco Use  . Smoking status: Never Smoker  . Smokeless tobacco: Never Used  Substance Use Topics  . Alcohol use: Yes    Alcohol/week: 2.0 standard drinks    Types: 1 Cans of beer, 1 Glasses of wine per week    Comment: 1-2  . Drug use: Never    Pertinent Clinical Results:  LABS: Labs reviewed: Acceptable for surgery.  Hospital Outpatient Visit on 12/12/2020  Component Date Value Ref Range Status  . WBC 12/12/2020 5.6  4.0 - 10.5 K/uL Final  . RBC 12/12/2020 3.75* 3.87 - 5.11 MIL/uL Final  . Hemoglobin 12/12/2020 11.2* 12.0 - 15.0 g/dL Final  . HCT 16/08/9603 34.8* 36.0 - 46.0 % Final  . MCV 12/12/2020 92.8  80.0 - 100.0 fL Final  . MCH 12/12/2020 29.9  26.0 - 34.0 pg Final  . MCHC 12/12/2020 32.2  30.0 - 36.0 g/dL Final  . RDW 54/07/8118 12.7  11.5 - 15.5 % Final  . Platelets  12/12/2020 238  150 - 400 K/uL Final  . nRBC 12/12/2020 0.0  0.0 - 0.2 % Final   Performed at [facility], [address] [place], [place] [address]  . Sodium 12/12/2020 139  135 - 145 mmol/L Final  . Potassium 12/12/2020 3.3* 3.5 - 5.1 mmol/L Final  . Chloride 12/12/2020 102  98 - 111 mmol/L Final  . CO2 12/12/2020 28  22 - 32 mmol/L Final  . Glucose, Bld 12/12/2020 102* 70 - 99 mg/dL Final   Glucose reference range applies only to samples taken after fasting for at least 8 hours.  . BUN 12/12/2020 18  6 - 20 mg/dL Final  . Creatinine, Ser 12/12/2020 0.58  0.44 - 1.00 mg/dL Final  . Calcium 95/62/1308 9.0  8.9 - 10.3 mg/dL Final  . GFR, Estimated 12/12/2020 >60  >60 mL/min Final   Comment: (NOTE) Calculated using the CKD-EPI Creatinine Equation (2021)   . Anion gap 12/12/2020 9  5 - 15 Final   Performed at [facility], [address] [place] [place], [place] [address]  .  ABO/RH(D) 12/12/2020 A POS   Final  . Antibody Screen 12/12/2020 NEG   Final  . Sample Expiration 12/12/2020 12/26/2020,2359   Final  . Extend sample reason 12/12/2020    Final                   Value:NO TRANSFUSIONS OR PREGNANCY IN THE PAST 3 MONTHS Performed at [facility], [address] [place], [place] [address]     ECG: Date: 12/12/2020 Time ECG obtained: 0754 AM Rate: 86 bpm Rhythm: normal sinus Axis (leads I and aVF): Normal Intervals: PR 146 ms. QRS seventy-two ms. QTc 433 ms. ST segment and T wave changes: No evidence of acute ST segment elevation or depression Comparison: Similar to previous tracing obtained on 05/21/2020   IMAGING / PROCEDURES: ECHOCARDIOGRAM performed on 12/06/2020 1. LVEF 58% 2. Normal biventricular systolic function 3. Normal right ventricular systolic function 4. Mild mitral valve regurgitation 5. No evidence of aortic stenosis 6. No evidence of pericardial effusion  Impression and Plan:  Ashley Mullen has been referred for pre-anesthesia  review and clearance prior to her undergoing the planned anesthetic and procedural courses. Available labs, pertinent testing, and imaging results were personally reviewed by me. This patient has been appropriately cleared by cardiology with an overall LOW risk of significant perioperative cardiovascular complications.  Based on clinical review performed today (12/12/20), barring any significant acute changes in the patient's overall condition, it is anticipated that she will be able to proceed with the planned surgical intervention. Any acute changes in clinical condition may necessitate her procedure being postponed and/or cancelled. Pre-surgical instructions were reviewed with the patient during her PAT appointment and questions were fielded by PAT clinical staff.  Quentin Mulling, MSN, APRN, FNP-C, CEN [facility]  Peri-operative Services Nurse Practitioner Phone: [phone] 12/12/20 11:09 AM  NOTE: This note has been prepared using Dragon dictation software. Despite my best ability to proofread, there is always the potential that unintentional transcriptional errors may still occur from this process.

## 2020-12-14 ENCOUNTER — Encounter: Payer: Self-pay | Admitting: Obstetrics and Gynecology

## 2020-12-14 ENCOUNTER — Ambulatory Visit: Payer: Medicaid Other | Admitting: Urgent Care

## 2020-12-14 ENCOUNTER — Other Ambulatory Visit: Payer: Self-pay

## 2020-12-14 ENCOUNTER — Observation Stay
Admission: RE | Admit: 2020-12-14 | Discharge: 2020-12-15 | Disposition: A | Discharge status: Home / Self Care | Payer: Medicaid Other | Source: Ambulatory Visit | Attending: Obstetrics and Gynecology | Admitting: Obstetrics and Gynecology

## 2020-12-14 ENCOUNTER — Encounter
Admission: RE | Disposition: A | Discharge status: Home / Self Care | Payer: Self-pay | Source: Ambulatory Visit | Attending: Obstetrics and Gynecology

## 2020-12-14 DIAGNOSIS — I509 Heart failure, unspecified: Secondary | ICD-10-CM | POA: Diagnosis not present

## 2020-12-14 DIAGNOSIS — N941 Unspecified dyspareunia: Secondary | ICD-10-CM | POA: Diagnosis not present

## 2020-12-14 DIAGNOSIS — N898 Other specified noninflammatory disorders of vagina: Secondary | ICD-10-CM | POA: Diagnosis not present

## 2020-12-14 DIAGNOSIS — Z9889 Other specified postprocedural states: Secondary | ICD-10-CM

## 2020-12-14 DIAGNOSIS — N920 Excessive and frequent menstruation with regular cycle: Principal | ICD-10-CM | POA: Insufficient documentation

## 2020-12-14 DIAGNOSIS — E119 Type 2 diabetes mellitus without complications: Secondary | ICD-10-CM | POA: Insufficient documentation

## 2020-12-14 DIAGNOSIS — I110 Hypertensive heart disease with heart failure: Secondary | ICD-10-CM | POA: Insufficient documentation

## 2020-12-14 DIAGNOSIS — I2510 Atherosclerotic heart disease of native coronary artery without angina pectoris: Secondary | ICD-10-CM | POA: Insufficient documentation

## 2020-12-14 HISTORY — DX: Nonrheumatic mitral (valve) insufficiency: I34.0

## 2020-12-14 HISTORY — PX: VAGINAL HYSTERECTOMY: SHX2639

## 2020-12-14 HISTORY — DX: Nonrheumatic aortic (valve) insufficiency: I35.1

## 2020-12-14 HISTORY — DX: Other nonspecific abnormal finding of lung field: R91.8

## 2020-12-14 HISTORY — PX: BILATERAL SALPINGECTOMY: SHX5743

## 2020-12-14 HISTORY — DX: Atherosclerotic heart disease of native coronary artery without angina pectoris: I25.10

## 2020-12-14 LAB — GLUCOSE, CAPILLARY
Glucose-Capillary: 105 mg/dL — ABNORMAL HIGH (ref 70–99)
Glucose-Capillary: 165 mg/dL — ABNORMAL HIGH (ref 70–99)
Glucose-Capillary: 128 mg/dL — ABNORMAL HIGH (ref 70–99)
Glucose-Capillary: 102 mg/dL — ABNORMAL HIGH (ref 70–99)
Glucose-Capillary: 117 mg/dL — ABNORMAL HIGH (ref 70–99)

## 2020-12-14 LAB — ABO/RH: ABO/RH(D): A POS

## 2020-12-14 LAB — POCT PREGNANCY, URINE: Preg Test, Ur: NEGATIVE

## 2020-12-14 SURGERY — HYSTERECTOMY, VAGINAL
Anesthesia: General

## 2020-12-14 MED ORDER — LIDOCAINE-EPINEPHRINE 1 %-1:100000 IJ SOLN
INTRAMUSCULAR | Status: DC | PRN
  Administered 2020-12-14: 9 mL

## 2020-12-14 MED ORDER — ONDANSETRON HCL 4 MG/2ML IJ SOLN
INTRAMUSCULAR | Status: AC
  Filled 2020-12-14: qty 2

## 2020-12-14 MED ORDER — KETOROLAC TROMETHAMINE 30 MG/ML IJ SOLN
30.0000 mg | Freq: Three times a day (TID) | INTRAMUSCULAR | Status: DC | PRN
  Administered 2020-12-15: 30 mg via INTRAVENOUS
  Filled 2020-12-14: qty 1

## 2020-12-14 MED ORDER — FENTANYL CITRATE (PF) 100 MCG/2ML IJ SOLN
INTRAMUSCULAR | Status: DC | PRN
  Administered 2020-12-14 (×3): 50 ug via INTRAVENOUS

## 2020-12-14 MED ORDER — FENTANYL CITRATE (PF) 100 MCG/2ML IJ SOLN
INTRAMUSCULAR | Status: AC
  Filled 2020-12-14: qty 2

## 2020-12-14 MED ORDER — METOCLOPRAMIDE HCL 5 MG/ML IJ SOLN
INTRAMUSCULAR | Status: AC
  Administered 2020-12-14: 10 mg via INTRAVENOUS
  Filled 2020-12-14: qty 2

## 2020-12-14 MED ORDER — GABAPENTIN 300 MG PO CAPS
ORAL_CAPSULE | ORAL | Status: AC
  Administered 2020-12-14: 300 mg via ORAL
  Filled 2020-12-14: qty 1

## 2020-12-14 MED ORDER — LACTATED RINGERS IV SOLN: INTRAVENOUS | Status: DC

## 2020-12-14 MED ORDER — ROCURONIUM BROMIDE 100 MG/10ML IV SOLN
INTRAVENOUS | Status: DC | PRN
  Administered 2020-12-14: 50 mg via INTRAVENOUS
  Administered 2020-12-14 (×2): 10 mg via INTRAVENOUS

## 2020-12-14 MED ORDER — CITALOPRAM HYDROBROMIDE 20 MG PO TABS
20.0000 mg | ORAL_TABLET | Freq: Every day | ORAL | Status: DC
  Filled 2020-12-14 (×2): qty 1

## 2020-12-14 MED ORDER — OXYCODONE-ACETAMINOPHEN 5-325 MG PO TABS
ORAL_TABLET | ORAL | Status: AC
  Filled 2020-12-14: qty 2

## 2020-12-14 MED ORDER — CHLORHEXIDINE GLUCONATE 0.12 % MT SOLN
OROMUCOSAL | Status: AC
  Administered 2020-12-14: 15 mL via OROMUCOSAL
  Filled 2020-12-14: qty 15

## 2020-12-14 MED ORDER — ONDANSETRON HCL 4 MG/2ML IJ SOLN
INTRAMUSCULAR | Status: DC | PRN
  Administered 2020-12-14: 4 mg via INTRAVENOUS

## 2020-12-14 MED ORDER — POVIDONE-IODINE 10 % EX SWAB: 2.0000 "application " | Freq: Once | CUTANEOUS | Status: DC

## 2020-12-14 MED ORDER — SUGAMMADEX SODIUM 200 MG/2ML IV SOLN
INTRAVENOUS | Status: DC | PRN
  Administered 2020-12-14: 200 mg via INTRAVENOUS

## 2020-12-14 MED ORDER — CHLORHEXIDINE GLUCONATE 0.12 % MT SOLN: 15.0000 mL | Freq: Once | OROMUCOSAL | Status: AC

## 2020-12-14 MED ORDER — DIPHENHYDRAMINE HCL 50 MG/ML IJ SOLN: 12.5000 mg | Freq: Four times a day (QID) | INTRAMUSCULAR | Status: DC | PRN

## 2020-12-14 MED ORDER — MORPHINE SULFATE 1 MG/ML IV SOLN PCA
INTRAVENOUS | Status: DC
  Administered 2020-12-14: 7.95 mg via INTRAVENOUS
  Administered 2020-12-14: 10.24 mg via INTRAVENOUS
  Administered 2020-12-15: 3.46 mg via INTRAVENOUS
  Administered 2020-12-15: 9.05 mg via INTRAVENOUS
  Administered 2020-12-15: 6.46 mg via INTRAVENOUS
  Filled 2020-12-14 (×3): qty 30

## 2020-12-14 MED ORDER — MORPHINE SULFATE (PF) 2 MG/ML IV SOLN
2.0000 mg | Freq: Once | INTRAVENOUS | Status: AC
  Administered 2020-12-14: 2 mg via INTRAVENOUS
  Filled 2020-12-14: qty 1

## 2020-12-14 MED ORDER — ONDANSETRON 4 MG PO TBDP
4.0000 mg | ORAL_TABLET | Freq: Four times a day (QID) | ORAL | Status: DC | PRN
  Administered 2020-12-14: 4 mg via ORAL

## 2020-12-14 MED ORDER — FENTANYL CITRATE (PF) 100 MCG/2ML IJ SOLN
INTRAMUSCULAR | Status: AC
  Administered 2020-12-14: 25 ug via INTRAVENOUS
  Filled 2020-12-14: qty 2

## 2020-12-14 MED ORDER — CEFAZOLIN SODIUM-DEXTROSE 2-4 GM/100ML-% IV SOLN
2.0000 g | Freq: Once | INTRAVENOUS | Status: AC
  Administered 2020-12-14: 2 g via INTRAVENOUS

## 2020-12-14 MED ORDER — LACTATED RINGERS IV SOLN: INTRAVENOUS | Status: DC | PRN

## 2020-12-14 MED ORDER — METOCLOPRAMIDE HCL 5 MG/ML IJ SOLN: 10.0000 mg | Freq: Once | INTRAMUSCULAR | Status: AC

## 2020-12-14 MED ORDER — NALOXONE HCL 0.4 MG/ML IJ SOLN: 0.4000 mg | INTRAMUSCULAR | Status: DC | PRN

## 2020-12-14 MED ORDER — GLYCOPYRROLATE 0.2 MG/ML IJ SOLN
INTRAMUSCULAR | Status: DC | PRN
  Administered 2020-12-14: .2 mg via INTRAVENOUS

## 2020-12-14 MED ORDER — DIPHENHYDRAMINE HCL 12.5 MG/5ML PO ELIX
12.5000 mg | ORAL_SOLUTION | Freq: Four times a day (QID) | ORAL | Status: DC | PRN
  Filled 2020-12-14: qty 5

## 2020-12-14 MED ORDER — LIDOCAINE HCL (CARDIAC) PF 100 MG/5ML IV SOSY
PREFILLED_SYRINGE | INTRAVENOUS | Status: DC | PRN
  Administered 2020-12-14: 100 mg via INTRAVENOUS

## 2020-12-14 MED ORDER — PROPOFOL 10 MG/ML IV BOLUS
INTRAVENOUS | Status: AC
  Filled 2020-12-14: qty 20

## 2020-12-14 MED ORDER — ACETAMINOPHEN 500 MG PO TABS: 1000.0000 mg | ORAL_TABLET | ORAL | Status: AC

## 2020-12-14 MED ORDER — ESMOLOL HCL 100 MG/10ML IV SOLN
INTRAVENOUS | Status: DC | PRN
  Administered 2020-12-14: 10 mg via INTRAVENOUS

## 2020-12-14 MED ORDER — FENTANYL CITRATE (PF) 100 MCG/2ML IJ SOLN
25.0000 ug | INTRAMUSCULAR | Status: AC | PRN
  Administered 2020-12-14 (×6): 25 ug via INTRAVENOUS

## 2020-12-14 MED ORDER — ACETAMINOPHEN 500 MG PO TABS
1000.0000 mg | ORAL_TABLET | Freq: Four times a day (QID) | ORAL | Status: DC | PRN
Start: 2020-12-14 — End: 2020-12-15
  Administered 2020-12-14: 1000 mg via ORAL
  Filled 2020-12-14: qty 2

## 2020-12-14 MED ORDER — ENALAPRIL MALEATE 2.5 MG PO TABS
2.5000 mg | ORAL_TABLET | Freq: Every day | ORAL | Status: DC
  Administered 2020-12-15: 2.5 mg via ORAL
  Filled 2020-12-14 (×2): qty 1

## 2020-12-14 MED ORDER — METFORMIN HCL 500 MG PO TABS
500.0000 mg | ORAL_TABLET | Freq: Two times a day (BID) | ORAL | Status: DC
  Administered 2020-12-15: 500 mg via ORAL
  Filled 2020-12-14 (×3): qty 1

## 2020-12-14 MED ORDER — GABAPENTIN 300 MG PO CAPS: 300.0000 mg | ORAL_CAPSULE | ORAL | Status: AC

## 2020-12-14 MED ORDER — ACETAMINOPHEN 500 MG PO TABS
ORAL_TABLET | ORAL | Status: AC
  Administered 2020-12-14: 1000 mg via ORAL
  Filled 2020-12-14: qty 2

## 2020-12-14 MED ORDER — PROPOFOL 10 MG/ML IV BOLUS
INTRAVENOUS | Status: DC | PRN
  Administered 2020-12-14: 160 mg via INTRAVENOUS

## 2020-12-14 MED ORDER — DEXAMETHASONE SODIUM PHOSPHATE 10 MG/ML IJ SOLN
INTRAMUSCULAR | Status: DC | PRN
  Administered 2020-12-14: 4 mg via INTRAVENOUS

## 2020-12-14 MED ORDER — OXYCODONE-ACETAMINOPHEN 5-325 MG PO TABS
1.0000 | ORAL_TABLET | Freq: Once | ORAL | Status: AC
  Administered 2020-12-14: 2 via ORAL

## 2020-12-14 MED ORDER — ONDANSETRON HCL 4 MG/2ML IJ SOLN: 4.0000 mg | Freq: Four times a day (QID) | INTRAMUSCULAR | Status: DC | PRN

## 2020-12-14 MED ORDER — ONDANSETRON HCL 4 MG/2ML IJ SOLN: 4.0000 mg | Freq: Once | INTRAMUSCULAR | Status: DC | PRN

## 2020-12-14 MED ORDER — SODIUM CHLORIDE 0.9 % IV SOLN: INTRAVENOUS | Status: DC

## 2020-12-14 MED ORDER — ORAL CARE MOUTH RINSE: 15.0000 mL | Freq: Once | OROMUCOSAL | Status: AC

## 2020-12-14 MED ORDER — ACETAMINOPHEN 650 MG RE SUPP: 650.0000 mg | Freq: Four times a day (QID) | RECTAL | Status: DC | PRN

## 2020-12-14 MED ORDER — CEFAZOLIN SODIUM-DEXTROSE 2-4 GM/100ML-% IV SOLN
INTRAVENOUS | Status: AC
  Filled 2020-12-14: qty 100

## 2020-12-14 MED ORDER — KETOROLAC TROMETHAMINE 30 MG/ML IJ SOLN
INTRAMUSCULAR | Status: DC | PRN
  Administered 2020-12-14: 30 mg via INTRAVENOUS

## 2020-12-14 MED ORDER — MIDAZOLAM HCL 2 MG/2ML IJ SOLN
INTRAMUSCULAR | Status: AC
  Filled 2020-12-14: qty 2

## 2020-12-14 MED ORDER — MIDAZOLAM HCL 2 MG/2ML IJ SOLN
INTRAMUSCULAR | Status: DC | PRN
  Administered 2020-12-14: 2 mg via INTRAVENOUS

## 2020-12-14 MED ORDER — SODIUM CHLORIDE 0.9% FLUSH: 9.0000 mL | INTRAVENOUS | Status: DC | PRN

## 2020-12-14 SURGICAL SUPPLY — 37 items
BAG URINE DRAIN 2000ML AR STRL (UROLOGICAL SUPPLIES) ×3 IMPLANT
CATH FOLEY 2WAY  5CC 16FR (CATHETERS) ×1
CATH ROBINSON RED A/P 16FR (CATHETERS) ×3 IMPLANT
CATH URTH 16FR FL 2W BLN LF (CATHETERS) ×2 IMPLANT
COVER WAND RF STERILE (DRAPES) ×3 IMPLANT
DRAPE PERI LITHO V/GYN (MISCELLANEOUS) ×3 IMPLANT
DRAPE SURG 17X11 SM STRL (DRAPES) ×3 IMPLANT
DRAPE UNDER BUTTOCK W/FLU (DRAPES) ×3 IMPLANT
ELECT REM PT RETURN 9FT ADLT (ELECTROSURGICAL) ×3
ELECTRODE REM PT RTRN 9FT ADLT (ELECTROSURGICAL) ×2 IMPLANT
GAUZE 4X4 16PLY RFD (DISPOSABLE) ×3 IMPLANT
GLOVE SURG SYN 8.0 (GLOVE) ×3 IMPLANT
GOWN STRL REUS W/ TWL LRG LVL3 (GOWN DISPOSABLE) ×6 IMPLANT
GOWN STRL REUS W/ TWL XL LVL3 (GOWN DISPOSABLE) ×2 IMPLANT
GOWN STRL REUS W/TWL LRG LVL3 (GOWN DISPOSABLE) ×3
GOWN STRL REUS W/TWL XL LVL3 (GOWN DISPOSABLE) ×1
KIT TURNOVER CYSTO (KITS) ×3 IMPLANT
LABEL OR SOLS (LABEL) ×3 IMPLANT
MANIFOLD NEPTUNE II (INSTRUMENTS) ×3 IMPLANT
NEEDLE HYPO 22GX1.5 SAFETY (NEEDLE) ×3 IMPLANT
PACK BASIN MINOR ARMC (MISCELLANEOUS) ×3 IMPLANT
PAD OB MATERNITY 4.3X12.25 (PERSONAL CARE ITEMS) ×3 IMPLANT
PAD PREP 24X41 OB/GYN DISP (PERSONAL CARE ITEMS) ×3 IMPLANT
SOL PREP PROV IODINE SCRUB 4OZ (MISCELLANEOUS) ×3 IMPLANT
SOL PREP PVP 2OZ (MISCELLANEOUS) ×3
SOLUTION PREP PVP 2OZ (MISCELLANEOUS) ×2 IMPLANT
SPONGE LAP 18X18 RF (DISPOSABLE) ×3 IMPLANT
SURGILUBE 2OZ TUBE FLIPTOP (MISCELLANEOUS) ×3 IMPLANT
SUT PDS 2-0 27IN (SUTURE) IMPLANT
SUT VIC AB 0 CT1 27 (SUTURE) ×3
SUT VIC AB 0 CT1 27XCR 8 STRN (SUTURE) ×6 IMPLANT
SUT VIC AB 0 CT1 36 (SUTURE) ×3 IMPLANT
SUT VIC AB 2-0 SH 27 (SUTURE) ×1
SUT VIC AB 2-0 SH 27XBRD (SUTURE) ×2 IMPLANT
SYR 10ML LL (SYRINGE) ×3 IMPLANT
SYR CONTROL 10ML LL (SYRINGE) ×3 IMPLANT
WATER STERILE IRR 1000ML POUR (IV SOLUTION) ×24 IMPLANT

## 2020-12-14 NOTE — Op Note (Signed)
[name], [name] MEDICAL RECORD [id number] ACCOUNT [id number] DATE OF BIRTH:[birth year] FACILITY: [facility] LOCATION: [facility] PHYSICIAN:Shyonna Carlin Cloyde Reams, MD  OPERATIVE REPORT  DATE OF PROCEDURE:  12/14/2020  PREOPERATIVE DIAGNOSES: 1.  Menorrhagia. 2.  Dyspareunia.  POSTOPERATIVE DIAGNOSES: 1.  Menorrhagia.   2.  Dyspareunia.  PROCEDURE: 1.  Total vaginal hysterectomy. 2.  Bilateral salpingectomy.  ANESTHESIA:  General endotracheal anesthesia.  SURGEON:  Jennell Corner, MD.  FIRST ASSISTANT:  Chelsea Ward, MD  INDICATIONS:  A 45 year old gravida 3, para 3 patient with a long history of menorrhagia with clot passage and significant dyspareunia.  The patient has opted for definitive surgical intervention.  DESCRIPTION OF PROCEDURE:  After adequate general endotracheal anesthesia, the patient was placed in dorsal supine position with the legs in the candy cane stirrups.  The patient's lower abdomen, vagina, and perineum were prepped and draped in normal  sterile fashion.  Timeout was performed.  The patient did receive 2 grams IV Ancef prior to commencement of the case for surgical prophylaxis.  A weighted speculum was placed in the posterior vaginal vault.  The bladder was drained yielding 100 mL clear  urine.  The anterior cervix and posterior cervix were grasped with 2 thyroid tenacula.  Cervix was then circumferentially injected with 1% lidocaine with 1:100,000 epinephrine.  A direct posterior colpotomy incision was made upon entry into the posterior  cul-de-sac, a long-billed speculum was placed.  Uterosacral ligaments were bilaterally clamped, transected and suture ligated and tagged for later identification.  Anterior cervix was circumferentially incised with the Bovie and the anterior cul-de-sac  was entered sharply without difficulty.  The uterine artery were bilaterally clamped, transected, suture ligated with 0 Vicryl suture.  Sequential bites and  transection through the broad ligament all the way to the cornua ensued.  The cornua were then  bilaterally clamped, transected and the uterus was delivered after previously decompressing the uterus with the Bovie.  Each fallopian tube was identified and the distal portion of fallopian tube was clamped, transected and suture ligated with 0 Vicryl  suture.  Pedicles appeared hemostatic.  The vagina was then closed with a running 0 Vicryl suture.  Good approximation of edges.  Good hemostasis was noted.  The bladder was catheterized at the end of the case yielding additional 50 mL clear urine.  COMPLICATIONS:  There were no complications.  ESTIMATED BLOOD LOSS:  130 mL.  INTRAOPERATIVE FLUIDS:  800 mL.  URINE OUTPUT:  150 mL.  The patient did receive 30 mg intravenous Toradol at the end of the case and she was taken to recovery room in good condition.  HN/NUANCE  D:12/14/2020 T:12/14/2020 JOB:[id number]

## 2020-12-14 NOTE — Progress Notes (Signed)
Patient ID: Ashley Mullen, female   DOB: 1976/07/24, 45 y.o.   MRN: [id number] Significant post op  pain despite percocet in postop area .  Admitted for pain control placed on Morphine PCA  Foley cath placed for urinary retention 500 cc. Started metformin 500 mg BID and placed her on clear liquids .

## 2020-12-14 NOTE — Brief Op Note (Signed)
12/14/2020  9:28 AM  PATIENT:  Ashley Mullen  45 y.o. female  PRE-OPERATIVE DIAGNOSIS:  menorrhagia, dyspareunia  POST-OPERATIVE DIAGNOSIS:  menorrhagia, dyspareunia  PROCEDURE:  Procedure(s): HYSTERECTOMY VAGINAL (N/A) BILATERAL SALPINGECTOMY (Bilateral)  SURGEON:  Surgeon(s) and Role:    * Terrace Chiem, Ihor Austin, MD - Primary    * Ward, Elenora Fender, MD - Assisting  PHYSICIAN ASSISTANT:   ASSISTANTS: cst    ANESTHESIA:   general  EBL:  130 mL   BLOOD ADMINISTERED:none  DRAINS: none   LOCAL MEDICATIONS USED:  LIDOCAINE   SPECIMEN:  Source of Specimen:  cervix , uterus and bilateral fallopian tubes  DISPOSITION OF SPECIMEN:  PATHOLOGY  COUNTS:  YES  TOURNIQUET:  * No tourniquets in log *  DICTATION: .Other Dictation: Dictation Number verbal  PLAN OF CARE: Discharge to home after PACU  PATIENT DISPOSITION:  PACU - hemodynamically stable.   Delay start of Pharmacological VTE agent (>24hrs) due to surgical blood loss or risk of bleeding: not applicable

## 2020-12-14 NOTE — Anesthesia Postprocedure Evaluation (Signed)
Anesthesia Post Note  Patient: Ashley Mullen  Procedure(s) Performed: HYSTERECTOMY VAGINAL (N/A ) BILATERAL SALPINGECTOMY (Bilateral )  Patient location during evaluation: PACU Anesthesia Type: General Level of consciousness: awake and alert Pain management: pain level controlled Vital Signs Assessment: post-procedure vital signs reviewed and stable Respiratory status: spontaneous breathing and respiratory function stable Cardiovascular status: stable Anesthetic complications: no   No complications documented.   Last Vitals:  Vitals:   12/14/20 0940 12/14/20 0945  BP: (!) 153/94 (!) 159/94  Pulse: 95 94  Resp: 20 11  Temp:    SpO2: 100% 100%    Last Pain:  Vitals:   12/14/20 0945  TempSrc:   PainSc: 8                  Eulas Schweitzer K

## 2020-12-14 NOTE — Progress Notes (Signed)
Pt is ready for TVH and bilateral salpingectomy .NPO  Glucose 105 this am . Neg HCG  All questions answered . Proceed

## 2020-12-14 NOTE — Transfer of Care (Signed)
Immediate Anesthesia Transfer of Care Note  Patient: Ashley Mullen  Procedure(s) Performed: HYSTERECTOMY VAGINAL (N/A ) BILATERAL SALPINGECTOMY (Bilateral )  Patient Location: PACU  Anesthesia Type:General  Level of Consciousness: awake and alert   Airway & Oxygen Therapy: Patient Spontanous Breathing and Patient connected to face mask oxygen  Post-op Assessment: Report given to RN and Post -op Vital signs reviewed and stable  Post vital signs: Reviewed and stable  Last Vitals:  Vitals Value Taken Time  BP 153/94 12/14/20 0940  Temp    Pulse 94 12/14/20 0944  Resp 11 12/14/20 0944  SpO2 100 % 12/14/20 0944  Vitals shown include unvalidated device data.  Last Pain:  Vitals:   12/14/20 0719  TempSrc: Oral  PainSc: 0-No pain         Complications: No complications documented.

## 2020-12-14 NOTE — Anesthesia Procedure Notes (Signed)
Procedure Name: Intubation Date/Time: 12/14/2020 8:11 AM Performed by: Babs Sciara, CRNA Pre-anesthesia Checklist: Patient identified, Emergency Drugs available, Suction available, Patient being monitored and Timeout performed Patient Re-evaluated:Patient Re-evaluated prior to induction Oxygen Delivery Method: Circle system utilized Preoxygenation: Pre-oxygenation with 100% oxygen Induction Type: IV induction Ventilation: Mask ventilation without difficulty Laryngoscope Size: Mac and 3 Grade View: Grade II Tube type: Oral Tube size: 7.0 mm Number of attempts: 1 Airway Equipment and Method: Stylet Placement Confirmation: ETT inserted through vocal cords under direct vision,  positive ETCO2 and breath sounds checked- equal and bilateral Secured at: 21 (lip) cm Tube secured with: Tape Dental Injury: Teeth and Oropharynx as per pre-operative assessment

## 2020-12-14 NOTE — Anesthesia Preprocedure Evaluation (Signed)
Anesthesia Evaluation  Patient identified by MRN, date of birth, ID band Patient awake    Reviewed: Allergy & Precautions, NPO status , Patient's Chart, lab work & pertinent test results  History of Anesthesia Complications Negative for: history of anesthetic complications  Airway Mallampati: III       Dental   Pulmonary asthma , neg sleep apnea, Not current smoker,           Cardiovascular hypertension (off meds now), (-) Past MI and (-) CHF (-) dysrhythmias (-) Valvular Problems/Murmurs     Neuro/Psych neg Seizures Anxiety Depression    GI/Hepatic Neg liver ROS, GERD  Medicated and Controlled,  Endo/Other  diabetes, Type 2, Oral Hypoglycemic Agents  Renal/GU negative Renal ROS     Musculoskeletal   Abdominal   Peds  Hematology   Anesthesia Other Findings   Reproductive/Obstetrics                             Anesthesia Physical Anesthesia Plan  ASA: III  Anesthesia Plan: General   Post-op Pain Management:    Induction: Intravenous  PONV Risk Score and Plan: 3 and Ondansetron, Dexamethasone and Midazolam  Airway Management Planned: Oral ETT  Additional Equipment:   Intra-op Plan:   Post-operative Plan:   Informed Consent: I have reviewed the patients History and Physical, chart, labs and discussed the procedure including the risks, benefits and alternatives for the proposed anesthesia with the patient or authorized representative who has indicated his/her understanding and acceptance.       Plan Discussed with:   Anesthesia Plan Comments:         Anesthesia Quick Evaluation

## 2020-12-14 NOTE — Progress Notes (Signed)
Message Dr Feliberto Gottron about patient having 10/10 pain. Surgeon at bedside. Will plan to keep patient over night for pain control

## 2020-12-14 NOTE — Progress Notes (Signed)
Patient voided 250mL

## 2020-12-14 NOTE — Progress Notes (Signed)
Patient had zofran, oxycodone, and gabapentin sent in prior to surgery. Her next dose of zofran is due at 7:17pm, gabapentin tonight at bedtime, oxycodone at 2:52pm. This was hand written on her discharge instructions.

## 2020-12-15 ENCOUNTER — Encounter: Payer: Self-pay | Admitting: Obstetrics and Gynecology

## 2020-12-15 DIAGNOSIS — N920 Excessive and frequent menstruation with regular cycle: Secondary | ICD-10-CM | POA: Diagnosis not present

## 2020-12-15 LAB — GLUCOSE, CAPILLARY
Glucose-Capillary: 109 mg/dL — ABNORMAL HIGH (ref 70–99)
Glucose-Capillary: 107 mg/dL — ABNORMAL HIGH (ref 70–99)

## 2020-12-15 LAB — CBC
HCT: 30.5 % — ABNORMAL LOW (ref 36.0–46.0)
Hemoglobin: 10 g/dL — ABNORMAL LOW (ref 12.0–15.0)
MCH: 29.9 pg (ref 26.0–34.0)
MCHC: 32.8 g/dL (ref 30.0–36.0)
MCV: 91 fL (ref 80.0–100.0)
Platelets: 232 10*3/uL (ref 150–400)
RBC: 3.35 MIL/uL — ABNORMAL LOW (ref 3.87–5.11)
RDW: 12.6 % (ref 11.5–15.5)
WBC: 8.4 10*3/uL (ref 4.0–10.5)
nRBC: 0 % (ref 0.0–0.2)

## 2020-12-15 LAB — BASIC METABOLIC PANEL
Anion gap: 8 (ref 5–15)
BUN: 7 mg/dL (ref 6–20)
CO2: 26 mmol/L (ref 22–32)
Calcium: 8.1 mg/dL — ABNORMAL LOW (ref 8.9–10.3)
Chloride: 105 mmol/L (ref 98–111)
Creatinine, Ser: 0.45 mg/dL (ref 0.44–1.00)
GFR, Estimated: >60 mL/min (ref >60)
Glucose, Bld: 111 mg/dL — ABNORMAL HIGH (ref 70–99)
Potassium: 2.9 mmol/L — ABNORMAL LOW (ref 3.5–5.1)
Sodium: 139 mmol/L (ref 135–145)

## 2020-12-15 MED ORDER — OXYCODONE-ACETAMINOPHEN 5-325 MG PO TABS
1.0000 | ORAL_TABLET | ORAL | Status: DC | PRN
  Administered 2020-12-15: 1 via ORAL
  Filled 2020-12-15: qty 1

## 2020-12-15 NOTE — Discharge Instructions (Signed)
AMBULATORY SURGERY  DISCHARGE INSTRUCTIONS   1) The drugs that you were given will stay in your system until tomorrow so for the next 24 hours you should not:  A) Drive an automobile B) Make any legal decisions C) Drink any alcoholic beverage   2) You may resume regular meals tomorrow.  Today it is better to start with liquids and gradually work up to solid foods.  You may eat anything you prefer, but it is better to start with liquids, then soup and crackers, and gradually work up to solid foods.   3) Please notify your doctor immediately if you have any unusual bleeding, trouble breathing, redness and pain at the surgery site, drainage, fever, or pain not relieved by medication. 4)   5) Your post-operative visit with Dr.                                     is: Date:                        Time:    Please call to schedule your post-operative visit.  6) Additional Instructions:    Vaginal Hysterectomy, Care After The following information offers guidance on how to care for yourself after your procedure. Your health care provider may also give you more specific instructions. If you have problems or questions, contact your health care provider. What can I expect after the procedure? After the procedure, it is common to have:  Pain in the lower abdomen and vagina.  Vaginal bleeding and discharge for up to 1 week. You will need to use a sanitary pad after this procedure.  Difficulty having a bowel movement (constipation).  Temporary problems emptying the bladder.  Tiredness (fatigue).  Poor appetite.  Less interest in sex.  Feelings of sadness or other emotions. If your ovaries were also removed, it is also common to have symptoms of menopause, such as hot flashes, night sweats, and lack of sleep (insomnia). Follow these instructions at home: Medicines  Take over-the-counter and prescription medicines only as told by your health care provider.  Do not take aspirin  or NSAIDs, such as ibuprofen. These medicines can cause bleeding.  Ask your health care provider if the medicine prescribed to you: ? Requires you to avoid driving or using machinery. ? Can cause constipation. You may need to take these actions to prevent or treat constipation:  Drink enough fluid to keep your urine pale yellow.  Take over-the-counter or prescription medicines.  Eat foods that are high in fiber, such as beans, whole grains, and fresh fruits and vegetables.  Limit foods that are high in fat and processed sugars, such as fried or sweet foods.   Activity  Rest as told by your health care provider.  Return to your normal activities as told by your health care provider. Ask your health care provider what activities are safe for you  Avoid sitting for a long time without moving. Get up to take short walks every 1-2 hours. This is important to improve blood flow and breathing. Ask for help if you feel weak or unsteady.  Try to have someone home with you for 1-2 weeks to help you with everyday chores.  Do not lift anything that is heavier than 10 lb (4.5 kg), or the limit that you are told, until your health care provider says that it is safe.  If you were given a sedative during the procedure, it can affect you for several hours. Do not drive or operate machinery until your health care provider says that it is safe.   Lifestyle  Do not use any products that contain nicotine or tobacco. These products include cigarettes, chewing tobacco, and vaping devices, such as e-cigarettes. These can delay healing after surgery. If you need help quitting, ask your health care provider.  Do not drink alcohol until your health care provider approves. General instructions  Do not douche, use tampons, or have sex for at least 6 weeks, or as told by your health care provider.  If you struggle with physical or emotional changes after your procedure, speak with your health care provider or a  therapist.  The stitches inside your vagina will dissolve over time and do not need to be taken out.  Do not take baths, swim, or use a hot tub until your health care provider approves. You may only be allowed to take showers for 2-3 weeks  Wear compression stockings as told by your health care provider. These stockings help to prevent blood clots and reduce swelling in your legs.  Keep all follow-up visits. This is important. Contact a health care provider if:  Your pain medicine is not helping.  You have a fever.  You have nausea or vomiting that does not go away.  You feel dizzy.  You have blood, pus, or a bad-smelling discharge from your vagina more than 1 week after the procedure.  You continue to have trouble urinating 3-5 days after the procedure. Get help right away if:  You have severe pain in your abdomen or back.  You faint.  You have heavy vaginal bleeding and blood clots, soaking through a sanitary pad in less than 1 hour.  You have chest pain or shortness of breath.  You have pain, swelling, or redness in your leg. These symptoms may represent a serious problem that is an emergency. Do not wait to see if the symptoms will go away. Get medical help right away. Call your local emergency services (911 in the [place] Do not drive yourself to the hospital. Summary  After the procedure, it is common to have pain, vaginal bleeding, constipation, temporary problems emptying your bladder, and feelings of sadness or other emotions.  Take over-the-counter and prescription medicines only as told by your health care provider.  Rest as told by your health care provider. Return to your normal activities as told by your health care provider.  Contact a health care provider if your pain medicine is not helping, or you have a fever, dizziness, or trouble urinating several days after the procedure.  Get help right away if you have severe pain in your abdomen or back, or if you  faint, have heavy bleeding, or have chest pain or shortness of breath. This information is not intended to replace advice given to you by your health care provider. Make sure you discuss any questions you have with your health care provider. Document Revised: 06/22/2020 Document Reviewed: 06/22/2020 Elsevier Patient Education  2021 [employer].

## 2020-12-15 NOTE — Progress Notes (Signed)
1 Day Post-Op Procedure(s) (LRB): HYSTERECTOMY VAGINAL (N/A) BILATERAL SALPINGECTOMY (Bilateral) Admitted over night for pain issue .  Subjective: Patient reports this am urinating without difficult . Pain much better , n ow off PCA .    Objective: I have reviewed patient's vital signs, intake and output, medications and labs.  General: alert and cooperative Resp: clear to auscultation bilaterally GI: soft, non-tender; bowel sounds normal; no masses,  no organomegaly  Assessment: s/p Procedure(s): HYSTERECTOMY VAGINAL (N/A) BILATERAL SALPINGECTOMY (Bilateral): stable  Plan: Discharge home  Precautions given    LOS: 0 days    Ashley Mullen 12/15/2020, 9:48 AM

## 2020-12-15 NOTE — Discharge Summary (Signed)
Physician Discharge Summary  Patient ID: Ashley Mullen MRN: [id number] DOB/AGE: 1976-08-12 45 y.o.  Admit date: 12/14/2020 Discharge date: 12/15/2020  Admission Diagnoses:menorrhagia , dyspareunia  Discharge Diagnoses:  Active Problems:   Post-operative state   Post-operative state   Discharged Condition: good  Hospital Course: pt underwent an uncomplicated TVH and bilateral salpingectomy . POST op pain severe . Admitted overnight placed on PCA . PAin much improved in am off PCA , po meds   Consults: None  Significant Diagnostic Studies: labs:  Results for orders placed or performed during the hospital encounter of 12/14/20 (from the past 24 hour(s))  Glucose, capillary     Status: Abnormal   Collection Time: 12/14/20  5:14 PM  Result Value Ref Range   Glucose-Capillary 128 (H) 70 - 99 mg/dL  Glucose, capillary     Status: Abnormal   Collection Time: 12/14/20  7:38 PM  Result Value Ref Range   Glucose-Capillary 102 (H) 70 - 99 mg/dL  Glucose, capillary     Status: Abnormal   Collection Time: 12/14/20 11:29 PM  Result Value Ref Range   Glucose-Capillary 117 (H) 70 - 99 mg/dL  Glucose, capillary     Status: Abnormal   Collection Time: 12/15/20  3:34 AM  Result Value Ref Range   Glucose-Capillary 109 (H) 70 - 99 mg/dL  Basic metabolic panel     Status: Abnormal   Collection Time: 12/15/20  5:50 AM  Result Value Ref Range   Sodium 139 135 - 145 mmol/L   Potassium 2.9 (L) 3.5 - 5.1 mmol/L   Chloride 105 98 - 111 mmol/L   CO2 26 22 - 32 mmol/L   Glucose, Bld 111 (H) 70 - 99 mg/dL   BUN 7 6 - 20 mg/dL   Creatinine, Ser 5.36 0.44 - 1.00 mg/dL   Calcium 8.1 (L) 8.9 - 10.3 mg/dL   GFR, Estimated >14 >43 mL/min   Anion gap 8 5 - 15  CBC     Status: Abnormal   Collection Time: 12/15/20  5:50 AM  Result Value Ref Range   WBC 8.4 4.0 - 10.5 K/uL   RBC 3.35 (L) 3.87 - 5.11 MIL/uL   Hemoglobin 10.0 (L) 12.0 - 15.0 g/dL   HCT 15.4 (L) 00.8 - 67.6 %   MCV 91.0 80.0 - 100.0 fL    MCH 29.9 26.0 - 34.0 pg   MCHC 32.8 30.0 - 36.0 g/dL   RDW 19.5 09.3 - 26.7 %   Platelets 232 150 - 400 K/uL   nRBC 0.0 0.0 - 0.2 %  Glucose, capillary     Status: Abnormal   Collection Time: 12/15/20  8:00 AM  Result Value Ref Range   Glucose-Capillary 107 (H) 70 - 99 mg/dL   Treatments: surgery: as above  Discharge Exam: Blood pressure 139/81, pulse 91, temperature 99 F (37.2 C), temperature source Oral, resp. rate 18, height 5\' 9"  (1.753 m), weight 105.2 kg, last menstrual period 12/04/2020, SpO2 98 %. General appearance: alert and cooperative Resp: clear to auscultation bilaterally Cardio: regular rate and rhythm, S1, S2 normal, no murmur, click, rub or gallop GI: soft, non-tender; bowel sounds normal; no masses,  no organomegaly  Disposition: Discharge disposition: 01-Home or Self Care       Discharge Instructions    Call MD for:   Complete by: As directed    Heavy vaginal bleeding   Call MD for:  difficulty breathing, headache or visual disturbances   Complete by: As directed  Call MD for:  difficulty breathing, headache or visual disturbances   Complete by: As directed    Call MD for:  extreme fatigue   Complete by: As directed    Call MD for:  extreme fatigue   Complete by: As directed    Call MD for:  hives   Complete by: As directed    Call MD for:  hives   Complete by: As directed    Call MD for:  persistant dizziness or light-headedness   Complete by: As directed    Call MD for:  persistant dizziness or light-headedness   Complete by: As directed    Call MD for:  persistant nausea and vomiting   Complete by: As directed    Call MD for:  persistant nausea and vomiting   Complete by: As directed    Call MD for:  redness, tenderness, or signs of infection (pain, swelling, redness, odor or green/yellow discharge around incision site)   Complete by: As directed    Call MD for:  redness, tenderness, or signs of infection (pain, swelling, redness, odor  or green/yellow discharge around incision site)   Complete by: As directed    Call MD for:  severe uncontrolled pain   Complete by: As directed    Call MD for:  severe uncontrolled pain   Complete by: As directed    Call MD for:  temperature >100.4   Complete by: As directed    Call MD for:  temperature >100.4   Complete by: As directed    Diet - low sodium heart healthy   Complete by: As directed    Increase activity slowly   Complete by: As directed    Increase activity slowly   Complete by: As directed    No wound care   Complete by: As directed      Allergies as of 12/15/2020   No Known Allergies     Medication List    TAKE these medications   Accu-Chek Aviva Plus test strip Generic drug: glucose blood   citalopram 20 MG tablet Commonly known as: CELEXA Take 20 mg by mouth daily.   D3-50 1.25 MG (50000 UT) capsule Generic drug: Cholecalciferol Take 1 capsule by mouth once a week. (Tuesdays)   enalapril 2.5 MG tablet Commonly known as: VASOTEC Take 2.5 mg by mouth daily.   fluticasone 50 MCG/ACT nasal spray Commonly known as: FLONASE Place 1 spray into both nostrils daily.   glimepiride 4 MG tablet Commonly known as: AMARYL Take 4 mg by mouth daily.   hydrOXYzine 25 MG tablet Commonly known as: ATARAX/VISTARIL Take 25 mg by mouth daily as needed for anxiety.   ibuprofen 200 MG tablet Commonly known as: ADVIL Take 400 mg by mouth 3 (three) times daily as needed.   loratadine 10 MG tablet Commonly known as: CLARITIN Take 10 mg by mouth daily.   metFORMIN 1000 MG tablet Commonly known as: GLUCOPHAGE Take 1,000 mg by mouth 2 (two) times daily.   metoprolol tartrate 100 MG tablet Commonly known as: LOPRESSOR Take 100 mg by mouth daily.   Ozempic (1 MG/DOSE) 4 MG/3ML Sopn Generic drug: Semaglutide (1 MG/DOSE) Inject 1 mg into the skin once a week. (Tuesday)   pantoprazole 40 MG tablet Commonly known as: PROTONIX Take 1 tablet by mouth 1 day or 1  dose.   ProAir HFA 108 (90 Base) MCG/ACT inhaler Generic drug: albuterol Inhale 1-2 puffs into the lungs every 6 (six) hours as needed for wheezing  or shortness of breath.   rosuvastatin 20 MG tablet Commonly known as: CRESTOR Take 20 mg by mouth daily.       Follow-up Information    Jahmani Staup, Ihor Austin, MD Follow up on 01/02/2021.   Specialty: Obstetrics and Gynecology Why: @2 :15 pm  Contact information: [address] [place] [phone]               Signed: [phone] Jerrad Mendibles 12/15/2020, 9:54 AM

## 2020-12-15 NOTE — Progress Notes (Signed)
Patient discharged home, discharge instructions given.  All questions answered.  Escorted by auxiliary.

## 2020-12-18 LAB — SURGICAL PATHOLOGY

## 2021-02-04 ENCOUNTER — Other Ambulatory Visit (HOSPITAL_COMMUNITY): Payer: Self-pay | Admitting: Specialist

## 2021-02-04 ENCOUNTER — Other Ambulatory Visit: Payer: Self-pay | Admitting: Specialist

## 2021-02-04 DIAGNOSIS — R918 Other nonspecific abnormal finding of lung field: Secondary | ICD-10-CM

## 2021-02-20 ENCOUNTER — Ambulatory Visit: Payer: Medicaid Other

## 2021-02-25 ENCOUNTER — Other Ambulatory Visit: Payer: Self-pay | Admitting: Internal Medicine

## 2021-02-25 DIAGNOSIS — Z1231 Encounter for screening mammogram for malignant neoplasm of breast: Secondary | ICD-10-CM

## 2021-04-05 ENCOUNTER — Encounter: Payer: Self-pay | Admitting: Obstetrics and Gynecology

## 2021-04-10 ENCOUNTER — Ambulatory Visit
Admission: RE | Admit: 2021-04-10 | Discharge: 2021-04-10 | Disposition: A | Discharge status: Home / Self Care | Payer: Medicaid Other | Source: Ambulatory Visit | Attending: Internal Medicine | Admitting: Internal Medicine

## 2021-04-10 ENCOUNTER — Other Ambulatory Visit: Payer: Self-pay

## 2021-04-10 DIAGNOSIS — Z1231 Encounter for screening mammogram for malignant neoplasm of breast: Secondary | ICD-10-CM

## 2021-08-07 ENCOUNTER — Other Ambulatory Visit: Payer: Self-pay | Admitting: Specialist

## 2021-08-07 DIAGNOSIS — R918 Other nonspecific abnormal finding of lung field: Secondary | ICD-10-CM

## 2022-02-10 ENCOUNTER — Ambulatory Visit: Payer: Medicaid Other

## 2022-02-14 IMAGING — CT CT CHEST W/O CM
2 of 4 series · 15 of 36 positions shown, 18 images · non-contrast
Comparison: 06/03/2018 and 02/09/2018.

CLINICAL DATA: New lung nodule peer

EXAM:
CT CHEST WITHOUT CONTRAST
TECHNIQUE: Multidetector CT imaging of the chest was performed following the
standard protocol without IV contrast.

[Series 2: chest 2.00 · axial · 0.76mm/px · z∈[-1245,-981]mm · 12 of 158 slices shown, 15 images]
[im 13/158  mediastinal]
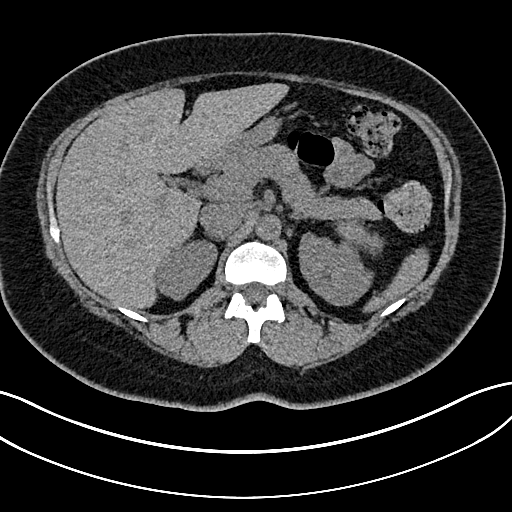
[im 13/158  lung]
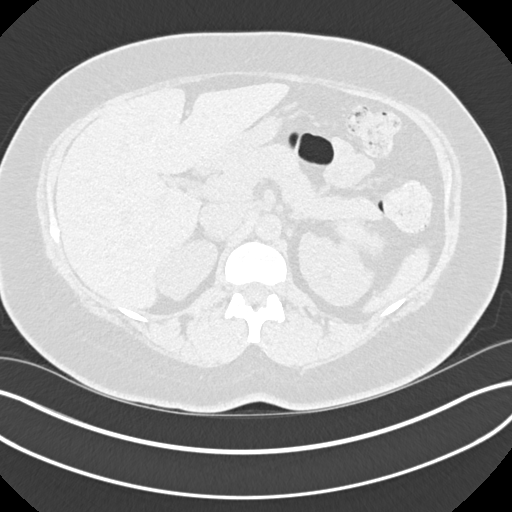
[im 25/158  lung]
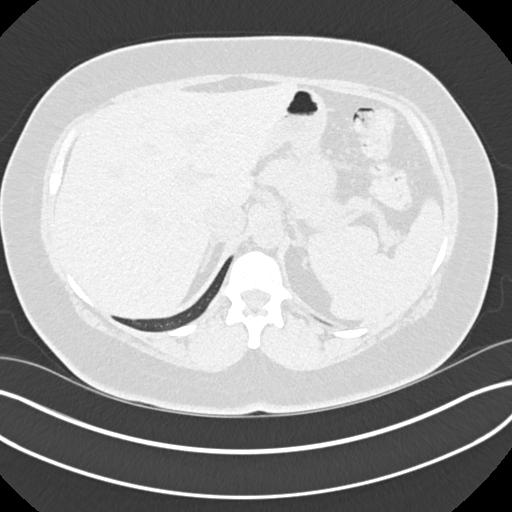
[im 37/158  lung]
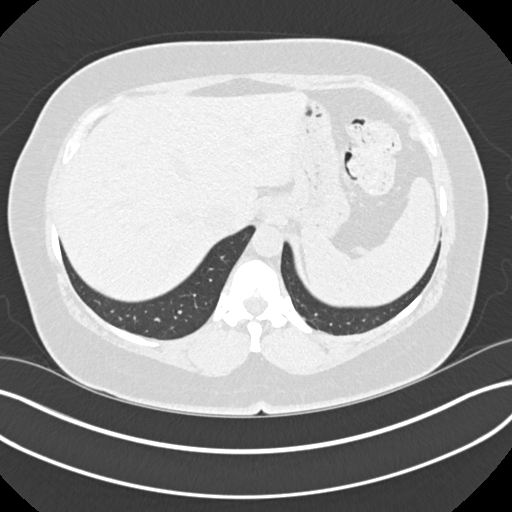
[im 49/158  lung]
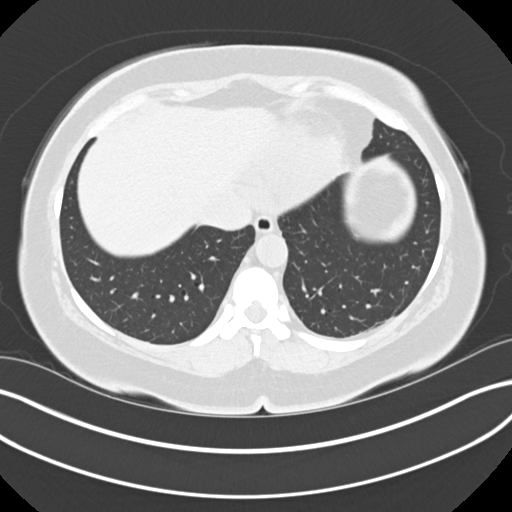
[im 61/158  mediastinal]
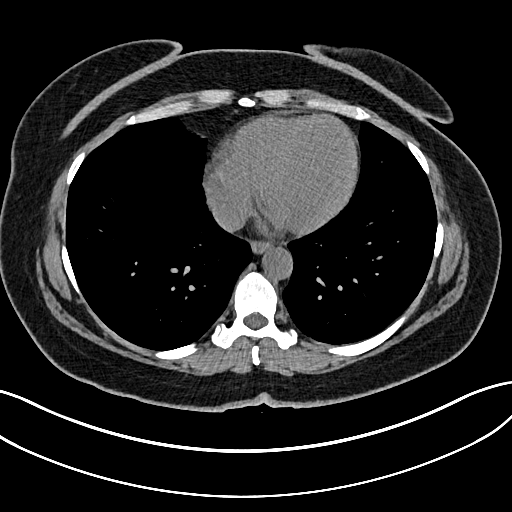
[im 61/158  lung]
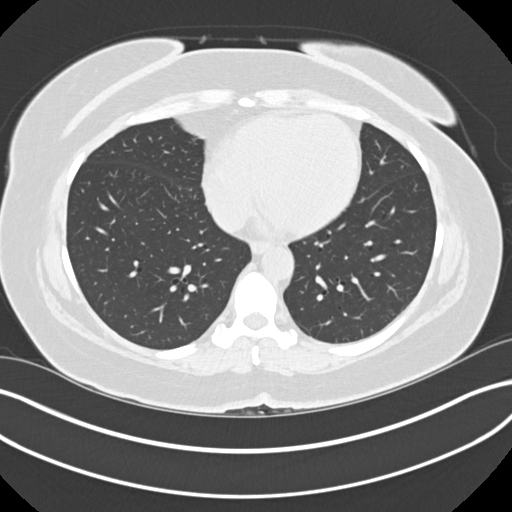
[im 73/158  lung]
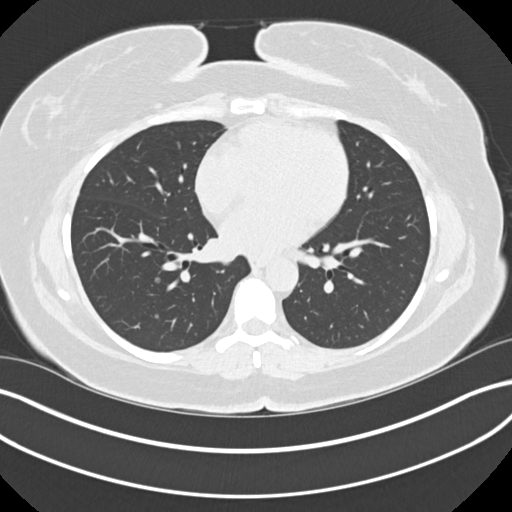
[im 85/158  lung]
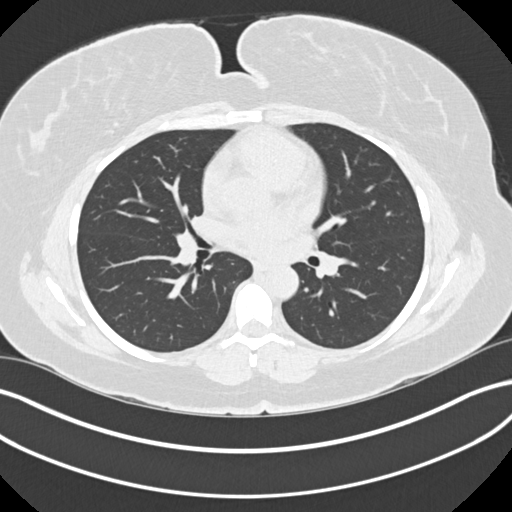
[im 97/158  lung]
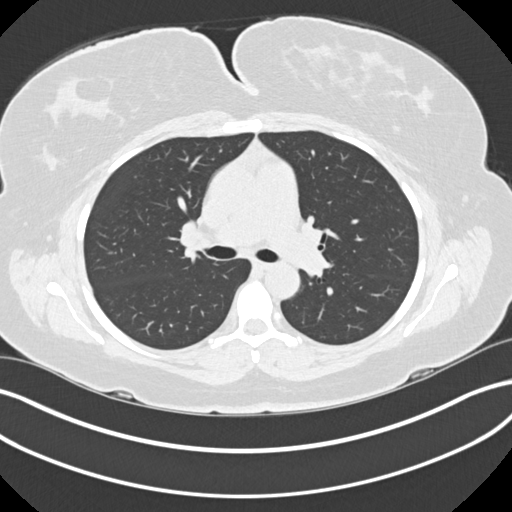
[im 109/158  mediastinal]
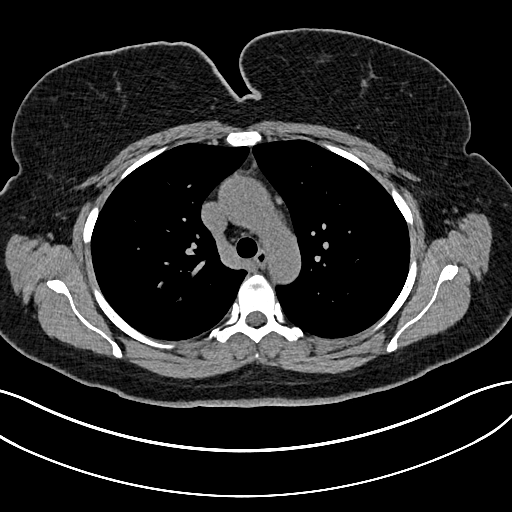
[im 109/158  lung]
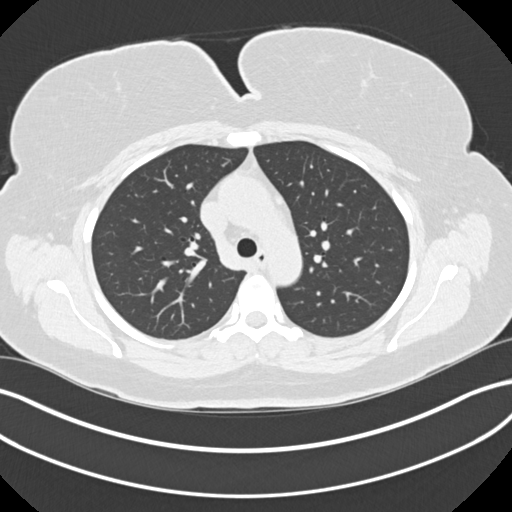
[im 121/158  lung]
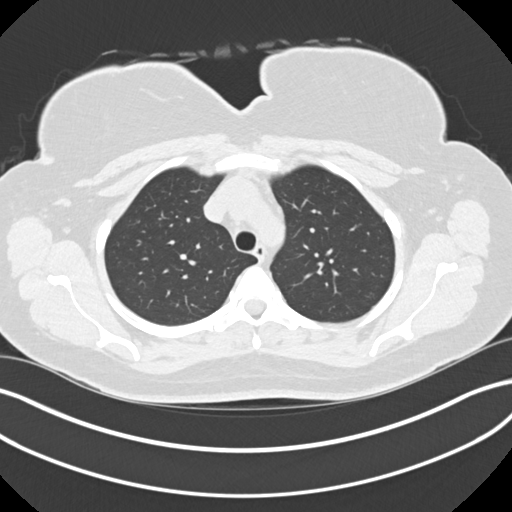
[im 133/158  lung]
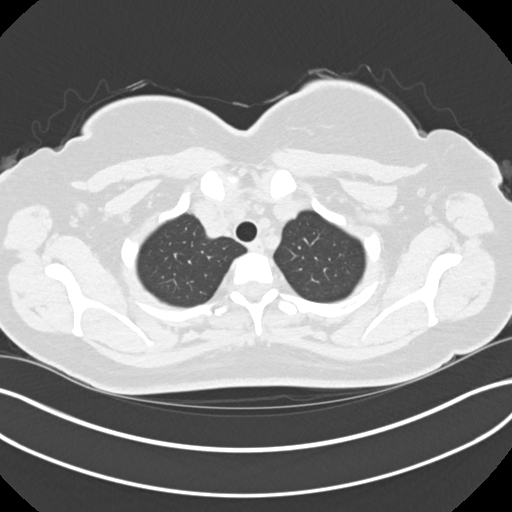
[im 145/158  lung]
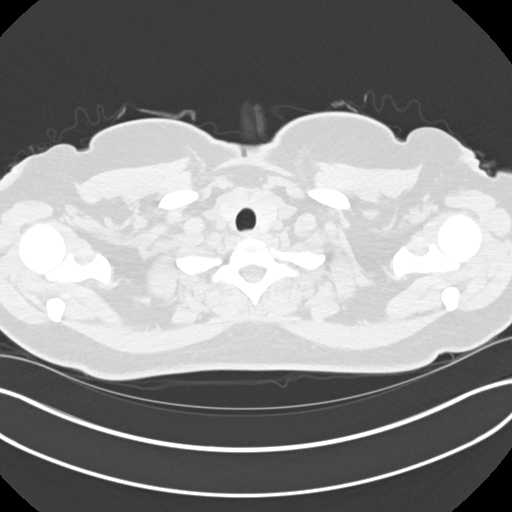

[Series 5: coronals chest 2.00 cor · coronal · 0.62mm/px · 3 of 177 slices shown]
[im 36/177  lung]
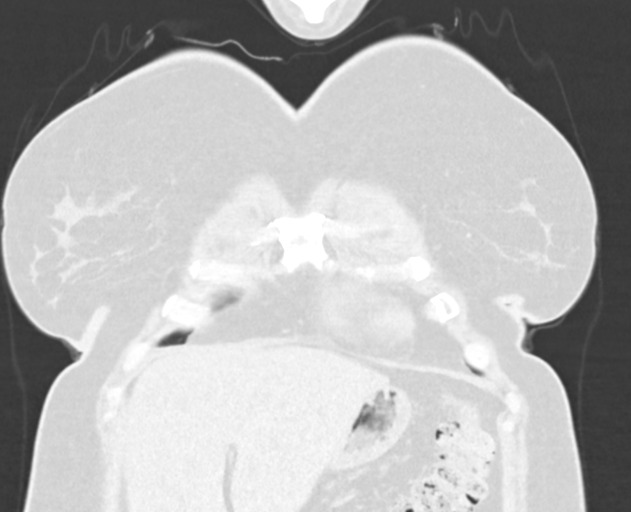
[im 71/177  lung]
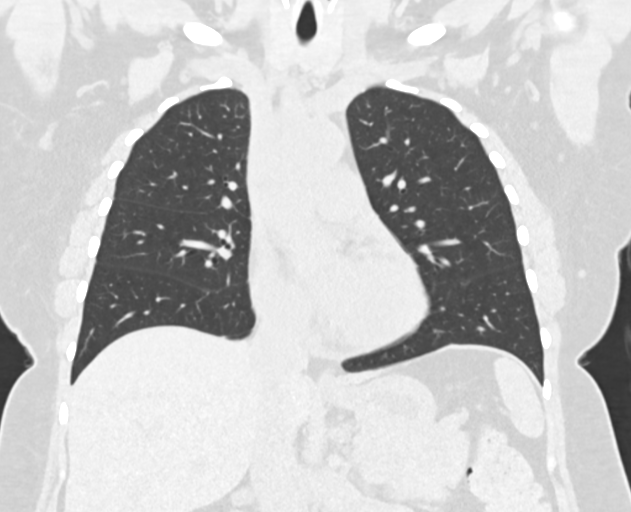
[im 106/177  lung]
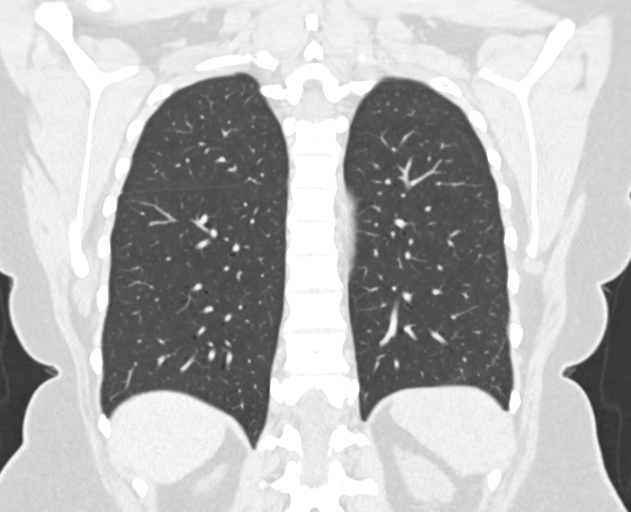

[15 of 36 positions shown; findings below may reference images not displayed]

FINDINGS: Cardiovascular: Heart size normal.  No pericardial effusion.

Mediastinum/Nodes: There may be a subtle low-attenuation lesion in
the left thyroid, 1.5 cm. No pathologically enlarged mediastinal or
axillary lymph nodes. Hilar regions are difficult to definitively
evaluate without IV contrast but appear grossly unremarkable. Thymic
tissue is seen in the prevascular space. Esophagus is unremarkable.

Lungs/Pleura: There are 2 ground-glass right lower lobe nodules,
measuring up to 4 mm (3/86), unchanged from 02/09/2018. Lungs are
otherwise clear. No pleural fluid. Airway is unremarkable.

Upper Abdomen: Visualized portions of the liver, adrenal glands,
kidneys, spleen, pancreas, stomach and bowel are grossly
unremarkable. No upper abdominal adenopathy.

Musculoskeletal: Minimal degenerative change in the spine.
IMPRESSION: 1. Right lower lobe nodules are unchanged from 02/09/2018.
Additional follow-up could be obtained in 2 years to ensure
continued stability. This recommendation follows the consensus
statement: Guidelines for Management of Small Pulmonary Nodules
Detected on CT Images: From the [HOSPITAL] 4371; Radiology
2. Possible low-attenuation lesion in the left thyroid. Recommend
thyroid US (ref: [HOSPITAL]. [DATE]): 143-50).

## 2022-02-19 ENCOUNTER — Ambulatory Visit: Payer: Medicaid Other

## 2022-03-10 ENCOUNTER — Other Ambulatory Visit: Payer: Self-pay | Admitting: Internal Medicine

## 2022-03-10 ENCOUNTER — Other Ambulatory Visit: Payer: Self-pay | Admitting: Family

## 2022-03-10 DIAGNOSIS — Z1231 Encounter for screening mammogram for malignant neoplasm of breast: Secondary | ICD-10-CM

## 2022-04-22 ENCOUNTER — Ambulatory Visit
Admission: RE | Admit: 2022-04-22 | Discharge: 2022-04-22 | Disposition: A | Discharge status: Home / Self Care | Payer: Medicaid Other | Source: Ambulatory Visit | Attending: Family | Admitting: Family

## 2022-04-22 DIAGNOSIS — Z1231 Encounter for screening mammogram for malignant neoplasm of breast: Secondary | ICD-10-CM | POA: Diagnosis not present

## 2022-04-24 ENCOUNTER — Other Ambulatory Visit: Payer: Self-pay | Admitting: Family

## 2022-04-24 DIAGNOSIS — R921 Mammographic calcification found on diagnostic imaging of breast: Secondary | ICD-10-CM

## 2022-04-24 DIAGNOSIS — R928 Other abnormal and inconclusive findings on diagnostic imaging of breast: Secondary | ICD-10-CM

## 2022-05-13 ENCOUNTER — Ambulatory Visit
Admission: RE | Admit: 2022-05-13 | Discharge: 2022-05-13 | Disposition: A | Discharge status: Home / Self Care | Payer: Medicaid Other | Source: Ambulatory Visit | Attending: Family | Admitting: Family

## 2022-05-13 DIAGNOSIS — R928 Other abnormal and inconclusive findings on diagnostic imaging of breast: Secondary | ICD-10-CM | POA: Diagnosis present

## 2022-05-13 DIAGNOSIS — R921 Mammographic calcification found on diagnostic imaging of breast: Secondary | ICD-10-CM | POA: Insufficient documentation

## 2022-05-19 ENCOUNTER — Other Ambulatory Visit: Payer: Self-pay | Admitting: Family

## 2022-05-19 DIAGNOSIS — R921 Mammographic calcification found on diagnostic imaging of breast: Secondary | ICD-10-CM

## 2022-05-19 DIAGNOSIS — R928 Other abnormal and inconclusive findings on diagnostic imaging of breast: Secondary | ICD-10-CM

## 2022-06-02 ENCOUNTER — Ambulatory Visit
Admission: RE | Admit: 2022-06-02 | Discharge: 2022-06-02 | Disposition: A | Discharge status: Home / Self Care | Payer: Medicaid Other | Source: Ambulatory Visit | Attending: Family | Admitting: Family

## 2022-06-02 DIAGNOSIS — R921 Mammographic calcification found on diagnostic imaging of breast: Secondary | ICD-10-CM | POA: Insufficient documentation

## 2022-06-02 DIAGNOSIS — R928 Other abnormal and inconclusive findings on diagnostic imaging of breast: Secondary | ICD-10-CM | POA: Diagnosis not present

## 2022-06-03 LAB — SURGICAL PATHOLOGY

## 2022-06-17 ENCOUNTER — Ambulatory Visit (LOCAL_COMMUNITY_HEALTH_CENTER): Payer: Medicaid Other

## 2022-06-17 DIAGNOSIS — Z23 Encounter for immunization: Secondary | ICD-10-CM | POA: Diagnosis not present

## 2022-06-17 DIAGNOSIS — Z719 Counseling, unspecified: Secondary | ICD-10-CM

## 2022-06-17 NOTE — Progress Notes (Signed)
  Are you feeling sick today? No   Have you ever received a dose of COVID-19 Vaccine? [employer], [place], [place], [place], Other) Yes  If yes, which vaccine and how many doses?   PFIZER, 3   Did you bring the vaccination record card or other documentation?  Yes   Do you have a health condition or are undergoing treatment that makes you moderately or severely immunocompromised? This would include, but not be limited to: cancer, HIV, organ transplant, immunosuppressive therapy/high-dose corticosteroids, or moderate/severe primary immunodeficiency.  No  Have you received COVID-19 vaccine before or during hematopoietic cell transplant (HCT) or CAR-T-cell therapies? No  Have you ever had an allergic reaction to: (This would include a severe allergic reaction or a reaction that caused hives, swelling, or respiratory distress, including wheezing.) A component of a COVID-19 vaccine or a previous dose of COVID-19 vaccine? No   Have you ever had an allergic reaction to another vaccine (other thanCOVID-19 vaccine) or an injectable medication? (This would include a severe allergic reaction or a reaction that caused hives, swelling, or respiratory distress, including wheezing.)   No    Do you have a history of any of the following:  Myocarditis or Pericarditis No  Dermal fillers:  No  Multisystem Inflammatory Syndrome (MIS-C or MIS-A)? No  COVID-19 disease within the past 3 months? No  Vaccinated with monkeypox vaccine in the last 4 weeks? No    Pt in clinic for first Pfizer BV booster. Eligible and administered, tolerated well. Given copy of [facility], [facility], and covid vaccination record card. M.Treson Laura, LPN.

## 2022-07-03 LAB — HM DIABETES EYE EXAM

## 2022-08-12 LAB — LIPID PANEL
Cholesterol: 176 (ref 0–200)
HDL: 47 (ref 35–70)
LDL Cholesterol: 107
Triglycerides: 125 (ref 40–160)

## 2022-08-12 LAB — BASIC METABOLIC PANEL
BUN: 19 (ref 4–21)
CO2: 26 — AB (ref 13–22)
Chloride: 101 (ref 99–108)
Creatinine: 0.7 (ref 0.5–1.1)
Glucose: 94
Potassium: 2.9 mEq/L — AB (ref 3.5–5.1)
Sodium: 143 (ref 137–147)

## 2022-08-12 LAB — CBC AND DIFFERENTIAL
HCT: 34 — AB (ref 36–46)
Hemoglobin: 11.8 — AB (ref 12.0–16.0)
Neutrophils Absolute: 3
Platelets: 261 10*3/uL (ref 150–400)
WBC: 5.2

## 2022-08-12 LAB — COMPREHENSIVE METABOLIC PANEL
Albumin: 4.2 (ref 3.5–5.0)
Calcium: 9.3 (ref 8.7–10.7)
Globulin: 2.3
eGFR: 103

## 2022-08-12 LAB — HEPATIC FUNCTION PANEL
ALT: 5 U/L — AB (ref 7–35)
AST: 8 — AB (ref 13–35)
Alkaline Phosphatase: 60 (ref 25–125)
Bilirubin, Direct: 0.3

## 2022-08-12 LAB — HEMOGLOBIN A1C: Hemoglobin A1C: 5.7

## 2022-08-12 LAB — VITAMIN D 25 HYDROXY (VIT D DEFICIENCY, FRACTURES): Vit D, 25-Hydroxy: 35.6

## 2022-08-12 LAB — CBC: RBC: 3.74 — AB (ref 3.87–5.11)

## 2022-08-12 LAB — VITAMIN B12: Vitamin B-12: 470

## 2022-10-15 ENCOUNTER — Other Ambulatory Visit: Payer: Self-pay | Admitting: Family

## 2022-10-15 ENCOUNTER — Encounter: Payer: Self-pay | Admitting: Family

## 2022-10-15 DIAGNOSIS — R921 Mammographic calcification found on diagnostic imaging of breast: Secondary | ICD-10-CM

## 2022-12-04 ENCOUNTER — Ambulatory Visit
Admission: RE | Admit: 2022-12-04 | Discharge: 2022-12-04 | Disposition: A | Discharge status: Home / Self Care | Payer: Medicaid Other | Source: Ambulatory Visit | Attending: Family | Admitting: Family

## 2022-12-04 ENCOUNTER — Other Ambulatory Visit: Payer: Self-pay | Admitting: Family

## 2022-12-04 DIAGNOSIS — R928 Other abnormal and inconclusive findings on diagnostic imaging of breast: Secondary | ICD-10-CM

## 2022-12-04 DIAGNOSIS — R921 Mammographic calcification found on diagnostic imaging of breast: Secondary | ICD-10-CM | POA: Insufficient documentation

## 2022-12-08 ENCOUNTER — Ambulatory Visit: Payer: Medicaid Other | Admitting: Family

## 2022-12-18 ENCOUNTER — Other Ambulatory Visit: Payer: Self-pay | Admitting: Cardiovascular Disease

## 2022-12-18 DIAGNOSIS — I351 Nonrheumatic aortic (valve) insufficiency: Secondary | ICD-10-CM

## 2022-12-26 ENCOUNTER — Ambulatory Visit: Payer: Medicaid Other | Admitting: Family

## 2023-01-01 ENCOUNTER — Other Ambulatory Visit: Payer: Self-pay | Admitting: Family

## 2023-01-06 ENCOUNTER — Ambulatory Visit: Payer: Medicaid Other | Admitting: Family

## 2023-01-06 ENCOUNTER — Encounter: Payer: Self-pay | Admitting: Family

## 2023-01-06 VITALS — BP 128/78 | HR 84 | Ht 69.0 in | Wt 217.0 lb

## 2023-01-06 DIAGNOSIS — E559 Vitamin D deficiency, unspecified: Secondary | ICD-10-CM

## 2023-01-06 DIAGNOSIS — E039 Hypothyroidism, unspecified: Secondary | ICD-10-CM | POA: Diagnosis not present

## 2023-01-06 DIAGNOSIS — I5032 Chronic diastolic (congestive) heart failure: Secondary | ICD-10-CM

## 2023-01-06 DIAGNOSIS — E538 Deficiency of other specified B group vitamins: Secondary | ICD-10-CM

## 2023-01-06 DIAGNOSIS — F411 Generalized anxiety disorder: Secondary | ICD-10-CM

## 2023-01-06 DIAGNOSIS — E1140 Type 2 diabetes mellitus with diabetic neuropathy, unspecified: Secondary | ICD-10-CM

## 2023-01-06 DIAGNOSIS — G43009 Migraine without aura, not intractable, without status migrainosus: Secondary | ICD-10-CM

## 2023-01-06 DIAGNOSIS — E782 Mixed hyperlipidemia: Secondary | ICD-10-CM | POA: Diagnosis not present

## 2023-01-06 DIAGNOSIS — I10 Essential (primary) hypertension: Secondary | ICD-10-CM

## 2023-01-06 NOTE — Progress Notes (Signed)
Established Patient Office Visit  Subjective:  Patient ID: Ashley Mullen, female    DOB: [birth year]  Age: 47 y.o. MRN: [id number]  Chief Complaint  Patient presents with   Follow-up    Follow up    Patient is here for her 3 month f/u.   She has been feeling well since her last appointment.    Due for labs No other concerns at this time    HM PAP         PAP SMEAR-Modifier (Every 5 Years) Next due on 03/12/2023   03/11/2018  HM PAP SMEAR    Health Maintenance reviewed - urine microalbumin ordered       Past Medical History:  Diagnosis Date   Anxiety    Aortic valve regurgitation    Arthritis    OSTEOARTHRITIS   Asthma    CAD (coronary artery disease)    Chronic diastolic heart failure (HCC)    Depression    Diabetes mellitus without complication (HCC)    GERD (gastroesophageal reflux disease)    Hyperlipidemia    Hypertension    Lung nodules    Mitral regurgitation     Past Surgical History:  Procedure Laterality Date   BILATERAL SALPINGECTOMY Bilateral 12/14/2020   Procedure: BILATERAL SALPINGECTOMY;  Surgeon: Schermerhorn, Gwen Her, MD;  Location: [facility];  Service: Gynecology;  Laterality: Bilateral;   CHOLECYSTECTOMY     TUBAL LIGATION  2011   VAGINAL HYSTERECTOMY N/A 12/14/2020   Procedure: HYSTERECTOMY VAGINAL;  Surgeon: Ouida Sills Gwen Her, MD;  Location: [facility];  Service: Gynecology;  Laterality: N/A;   WISDOM TOOTH EXTRACTION      Social History   Socioeconomic History   Marital status: Single    Spouse name: Not on file   Number of children: 2   Years of education: Not on file   Highest education level: Not on file  Occupational History   Not on file  Tobacco Use   Smoking status: Never   Smokeless tobacco: Never  Substance and Sexual Activity   Alcohol use: Yes    Alcohol/week: 2.0 standard drinks of alcohol    Types: 1 Cans of beer, 1 Glasses of wine per week    Comment: 1-2   Drug use: Never   Sexual activity: Not on  file  Other Topics Concern   Not on file  Social History Narrative   Not on file   Social Determinants of Health   Financial Resource Strain: Not on file  Food Insecurity: Not on file  Transportation Needs: Not on file  Physical Activity: Not on file  Stress: Not on file  Social Connections: Not on file  Intimate Partner Violence: Not on file    Family History  Problem Relation Age of Onset   Breast cancer Mother        71's   Bladder Cancer Father    Lung cancer Maternal Grandmother    Breast cancer Paternal Aunt     No Known Allergies  ROS     Objective:   BP 128/78   Pulse 84   Ht '5\' 9"'$  (1.753 m)   Wt 217 lb (98.4 kg)   LMP 03/27/2021   SpO2 99%   BMI 32.05 kg/m   Vitals:   01/06/23 0934  BP: 128/78  Pulse: 84  Height: '5\' 9"'$  (1.753 m)  Weight: 217 lb (98.4 kg)  SpO2: 99%  BMI (Calculated): 32.03    Physical Exam   Results for orders placed or performed  in visit on 01/06/23  CBC and differential  Result Value Ref Range   Hemoglobin 11.8 (A) 12.0 - 16.0   HCT 34 (A) 36 - 46   Neutrophils Absolute 3.00    Platelets 261 150 - 400 K/uL   WBC 5.2   CBC  Result Value Ref Range   RBC 3.74 (A) 3.87 - 5.11  VITAMIN D 25 Hydroxy (Vit-D Deficiency, Fractures)  Result Value Ref Range   Vit D, 25-Hydroxy 35.6   HM PAP SMEAR  Result Value Ref Range   HM Pap smear NIL   Basic metabolic panel  Result Value Ref Range   Glucose 94    BUN 19 4 - 21   CO2 26 (A) 13 - 22   Creatinine 0.7 0.5 - 1.1   Potassium 2.9 (A) 3.5 - 5.1 mEq/L   Sodium 143 137 - 147   Chloride 101 99 - 108  Comprehensive metabolic panel  Result Value Ref Range   Globulin 2.3    eGFR 103    Calcium 9.3 8.7 - 10.7   Albumin 4.2 3.5 - 5.0  Lipid panel  Result Value Ref Range   Triglycerides 125 40 - 160   Cholesterol 176 0 - 200   HDL 47 35 - 70   LDL Cholesterol 107   Hepatic function panel  Result Value Ref Range   Alkaline Phosphatase 60 25 - 125   ALT 5 (A) 7 - 35  U/L   AST 8 (A) 13 - 35   Bilirubin, Direct 0.3 0.01 - 0.4  Vitamin B12  Result Value Ref Range   Vitamin B-12 470   Hemoglobin A1c  Result Value Ref Range   Hemoglobin A1C 5.7   HM DIABETES EYE EXAM  Result Value Ref Range   HM Diabetic Eye Exam No Retinopathy No Retinopathy  Results Console HPV  Result Value Ref Range   CHL HPV Negative     No results found for this or any previous visit (from the past 2160 hour(s)).    Assessment & Plan:   Problem List Items Addressed This Visit     Vitamin B 12 deficiency   Relevant Orders   Vitamin B12   Essential hypertension, benign - Primary   Relevant Orders   CBC With Differential   CMP14+EGFR   Generalized anxiety disorder   Migraine without aura and without status migrainosus, not intractable (Chronic)   Relevant Medications   gabapentin (NEURONTIN) 100 MG capsule   ibuprofen (ADVIL) 800 MG tablet   Other Relevant Orders   Vitamin B12   Mixed hyperlipidemia   Relevant Orders   Lipid panel   Type 2 diabetes, controlled, with neuropathy ([place])   Relevant Orders   Hemoglobin A1c   POC CREATINE & ALBUMIN,URINE   Chronic diastolic heart failure (HCC)   Vitamin D deficiency, unspecified   Relevant Orders   VITAMIN D 25 Hydroxy (Vit-D Deficiency, Fractures)   Other Visit Diagnoses     Hypothyroidism (acquired)       Relevant Orders   CBC With Differential   CMP14+EGFR   TSH       Return in about 3 months (around 04/08/2023).   Total time spent: 30 minutes  Mechele Claude, FNP  01/06/2023

## 2023-01-13 ENCOUNTER — Other Ambulatory Visit: Payer: Medicaid Other

## 2023-01-19 ENCOUNTER — Ambulatory Visit: Payer: Medicaid Other | Admitting: Cardiovascular Disease

## 2023-01-29 ENCOUNTER — Other Ambulatory Visit: Payer: Self-pay | Admitting: Family

## 2023-02-06 ENCOUNTER — Other Ambulatory Visit (INDEPENDENT_AMBULATORY_CARE_PROVIDER_SITE_OTHER): Payer: Medicaid Other

## 2023-02-06 DIAGNOSIS — E1165 Type 2 diabetes mellitus with hyperglycemia: Secondary | ICD-10-CM

## 2023-02-06 DIAGNOSIS — I10 Essential (primary) hypertension: Secondary | ICD-10-CM

## 2023-02-06 DIAGNOSIS — E559 Vitamin D deficiency, unspecified: Secondary | ICD-10-CM

## 2023-02-06 DIAGNOSIS — E039 Hypothyroidism, unspecified: Secondary | ICD-10-CM

## 2023-02-06 DIAGNOSIS — E782 Mixed hyperlipidemia: Secondary | ICD-10-CM

## 2023-02-06 DIAGNOSIS — E538 Deficiency of other specified B group vitamins: Secondary | ICD-10-CM

## 2023-02-06 LAB — POC CREATINE & ALBUMIN,URINE
Albumin/Creatinine Ratio, Urine, POC: <30
Creatinine, POC: 300 mg/dL
Microalbumin Ur, POC: 30 mg/L

## 2023-02-07 ENCOUNTER — Other Ambulatory Visit: Payer: Self-pay | Admitting: Family

## 2023-02-07 LAB — CBC WITH DIFFERENTIAL
Basophils Absolute: 0 10*3/uL (ref 0.0–0.2)
Basos: 1 %
EOS (ABSOLUTE): 0.2 10*3/uL (ref 0.0–0.4)
Eos: 3 %
Hematocrit: 36.4 % (ref 34.0–46.6)
Hemoglobin: 11.9 g/dL (ref 11.1–15.9)
Immature Grans (Abs): 0 10*3/uL (ref 0.0–0.1)
Immature Granulocytes: 0 %
Lymphocytes Absolute: 1.8 10*3/uL (ref 0.7–3.1)
Lymphs: 29 %
MCH: 30.7 pg (ref 26.6–33.0)
MCHC: 32.7 g/dL (ref 31.5–35.7)
MCV: 94 fL (ref 79–97)
Monocytes Absolute: 0.5 10*3/uL (ref 0.1–0.9)
Monocytes: 8 %
Neutrophils Absolute: 3.7 10*3/uL (ref 1.4–7.0)
Neutrophils: 59 %
RBC: 3.88 x10E6/uL (ref 3.77–5.28)
RDW: 12 % (ref 11.7–15.4)
WBC: 6.3 10*3/uL (ref 3.4–10.8)

## 2023-02-07 LAB — CMP14+EGFR
ALT: 8 IU/L (ref 0–32)
AST: 9 IU/L (ref 0–40)
Albumin/Globulin Ratio: 1.9 (ref 1.2–2.2)
Albumin: 4.3 g/dL (ref 3.9–4.9)
Alkaline Phosphatase: 65 IU/L (ref 44–121)
BUN/Creatinine Ratio: 20 (ref 9–23)
BUN: 12 mg/dL (ref 6–24)
Bilirubin Total: 0.3 mg/dL (ref 0.0–1.2)
CO2: 22 mmol/L (ref 20–29)
Calcium: 9.1 mg/dL (ref 8.7–10.2)
Chloride: 103 mmol/L (ref 96–106)
Creatinine, Ser: 0.61 mg/dL (ref 0.57–1.00)
Globulin, Total: 2.3 g/dL (ref 1.5–4.5)
Glucose: 108 mg/dL — ABNORMAL HIGH (ref 70–99)
Potassium: 3.6 mmol/L (ref 3.5–5.2)
Sodium: 143 mmol/L (ref 134–144)
Total Protein: 6.6 g/dL (ref 6.0–8.5)
eGFR: 112 mL/min/{1.73_m2} (ref >59)

## 2023-02-07 LAB — LIPID PANEL
Chol/HDL Ratio: 3.2 ratio (ref 0.0–4.4)
Cholesterol, Total: 168 mg/dL (ref 100–199)
HDL: 52 mg/dL (ref >39)
LDL Chol Calc (NIH): 95 mg/dL (ref 0–99)
Triglycerides: 118 mg/dL (ref 0–149)
VLDL Cholesterol Cal: 21 mg/dL (ref 5–40)

## 2023-02-07 LAB — HEMOGLOBIN A1C
Est. average glucose Bld gHb Est-mCnc: 120 mg/dL
Hgb A1c MFr Bld: 5.8 % — ABNORMAL HIGH (ref 4.8–5.6)

## 2023-02-07 LAB — VITAMIN D 25 HYDROXY (VIT D DEFICIENCY, FRACTURES): Vit D, 25-Hydroxy: 30.6 ng/mL (ref 30.0–100.0)

## 2023-02-07 LAB — TSH: TSH: 1.36 u[IU]/mL (ref 0.450–4.500)

## 2023-02-07 LAB — VITAMIN B12: Vitamin B-12: 218 pg/mL — ABNORMAL LOW (ref 232–1245)

## 2023-02-17 ENCOUNTER — Encounter: Payer: Self-pay | Admitting: Family

## 2023-02-18 ENCOUNTER — Other Ambulatory Visit: Payer: Medicaid Other

## 2023-02-23 ENCOUNTER — Ambulatory Visit: Payer: Medicaid Other | Admitting: Cardiovascular Disease

## 2023-03-21 ENCOUNTER — Other Ambulatory Visit: Payer: Self-pay | Admitting: Family

## 2023-04-08 ENCOUNTER — Encounter: Payer: Self-pay | Admitting: Family

## 2023-04-08 ENCOUNTER — Ambulatory Visit: Payer: Medicaid Other | Admitting: Family

## 2023-04-08 VITALS — BP 130/78 | HR 82 | Ht 69.0 in | Wt 216.0 lb

## 2023-04-08 DIAGNOSIS — E782 Mixed hyperlipidemia: Secondary | ICD-10-CM | POA: Diagnosis not present

## 2023-04-08 DIAGNOSIS — E1165 Type 2 diabetes mellitus with hyperglycemia: Secondary | ICD-10-CM

## 2023-04-08 DIAGNOSIS — E559 Vitamin D deficiency, unspecified: Secondary | ICD-10-CM

## 2023-04-08 DIAGNOSIS — I5032 Chronic diastolic (congestive) heart failure: Secondary | ICD-10-CM

## 2023-04-08 DIAGNOSIS — F411 Generalized anxiety disorder: Secondary | ICD-10-CM | POA: Diagnosis not present

## 2023-04-08 DIAGNOSIS — F331 Major depressive disorder, recurrent, moderate: Secondary | ICD-10-CM

## 2023-04-08 DIAGNOSIS — G43009 Migraine without aura, not intractable, without status migrainosus: Secondary | ICD-10-CM

## 2023-04-08 DIAGNOSIS — E538 Deficiency of other specified B group vitamins: Secondary | ICD-10-CM

## 2023-04-08 DIAGNOSIS — I10 Essential (primary) hypertension: Secondary | ICD-10-CM | POA: Diagnosis not present

## 2023-04-08 DIAGNOSIS — E1140 Type 2 diabetes mellitus with diabetic neuropathy, unspecified: Secondary | ICD-10-CM

## 2023-04-08 LAB — POCT CBG (FASTING - GLUCOSE)-MANUAL ENTRY: Glucose Fasting, POC: 97 mg/dL (ref 70–99)

## 2023-04-08 MED ORDER — UBRELVY 100 MG PO TABS: 100.0000 mg | ORAL_TABLET | ORAL | 3 refills | Status: DC | PRN

## 2023-04-08 MED ORDER — CYANOCOBALAMIN 1000 MCG/ML IJ SOLN
1000.0000 ug | Freq: Once | INTRAMUSCULAR | Status: AC
Start: 2023-04-08 — End: 2023-04-08
  Administered 2023-04-08: 1000 ug via INTRAMUSCULAR

## 2023-04-08 MED ORDER — ESCITALOPRAM OXALATE 20 MG PO TABS
20.0000 mg | ORAL_TABLET | Freq: Every day | ORAL | 1 refills | Status: DC
Start: 2023-04-08 — End: 2023-09-29

## 2023-04-08 NOTE — Assessment & Plan Note (Addendum)
Checking labs today. Will call pt. With results  Patient has not been taking her farxiga, so will give her some Jardiance to try.  She will let me know if this is better, as she was having side effects from the [place].    Continue current diabetes POC, as patient has been well controlled on current regimen.  Will adjust meds if needed based on labs.

## 2023-04-08 NOTE — Assessment & Plan Note (Signed)
Pt. Is managed by cardiology.  Per Dr. Welton Flakes at last visit, patient is doing well from a cardiac perspective.   Will defer to them for further changes of treatment plan.

## 2023-04-08 NOTE — Assessment & Plan Note (Signed)
Increasing Escitalopram dose.  Pt. Denies any significant anxiety symptoms, but her depressive symptoms today are worse.   Will reassess at follow up.  

## 2023-04-08 NOTE — Assessment & Plan Note (Signed)
Blood pressure well controlled with current medications.  Continue current therapy.  Will reassess at follow up.  

## 2023-04-08 NOTE — Progress Notes (Signed)
Established Patient Office Visit  Subjective:  Patient ID: Ashley Mullen, female    DOB: 1976-10-22  Age: 47 y.o. MRN: [id number]  Chief Complaint  Patient presents with   Follow-up    3 month follow up      Patient is here today for her 3 months follow up.  She has been feeling well since last appointment.   She does not have additional concerns to discuss today.  Labs are due today. She needs refills.   I have reviewed her active problem list, medication list, allergies, notes from last encounter, lab results for her appointment today.    No other concerns at this time.   Past Medical History:  Diagnosis Date   Anxiety    Aortic valve regurgitation    Arthritis    OSTEOARTHRITIS   Asthma    CAD (coronary artery disease)    Chronic diastolic heart failure (HCC)    Depression    Diabetes mellitus without complication (HCC)    GERD (gastroesophageal reflux disease)    Hyperlipidemia    Hypertension    Lung nodules    Mitral regurgitation     Past Surgical History:  Procedure Laterality Date   BILATERAL SALPINGECTOMY Bilateral 12/14/2020   Procedure: BILATERAL SALPINGECTOMY;  Surgeon: Schermerhorn, Ihor Austin, MD;  Location: [facility];  Service: Gynecology;  Laterality: Bilateral;   CHOLECYSTECTOMY     TUBAL LIGATION  2011   VAGINAL HYSTERECTOMY N/A 12/14/2020   Procedure: HYSTERECTOMY VAGINAL;  Surgeon: Feliberto Gottron Ihor Austin, MD;  Location: [facility];  Service: Gynecology;  Laterality: N/A;   WISDOM TOOTH EXTRACTION      Social History   Socioeconomic History   Marital status: Single    Spouse name: Not on file   Number of children: 2   Years of education: Not on file   Highest education level: Not on file  Occupational History   Not on file  Tobacco Use   Smoking status: Never   Smokeless tobacco: Never  Substance and Sexual Activity   Alcohol use: Yes    Alcohol/week: 2.0 standard drinks of alcohol    Types: 1 Cans of beer, 1 Glasses of wine per week     Comment: 1-2   Drug use: Never   Sexual activity: Not on file  Other Topics Concern   Not on file  Social History Narrative   Not on file   Social Determinants of Health   Financial Resource Strain: Not on file  Food Insecurity: Not on file  Transportation Needs: Not on file  Physical Activity: Not on file  Stress: Not on file  Social Connections: Not on file  Intimate Partner Violence: Not on file    Family History  Problem Relation Age of Onset   Breast cancer Mother        23's   Bladder Cancer Father    Lung cancer Maternal Grandmother    Breast cancer Paternal Aunt     No Known Allergies  Review of Systems  Musculoskeletal:  Positive for joint pain.  Psychiatric/Behavioral:  Positive for depression.   All other systems reviewed and are negative.      Objective:   BP 130/78   Pulse 82   Ht 5\' 9"  (1.753 m)   Wt 216 lb (98 kg)   LMP 03/27/2021   SpO2 99%   BMI 31.90 kg/m   Vitals:   04/08/23 0847  BP: 130/78  Pulse: 82  Height: 5\' 9"  (1.753 m)  Weight:  216 lb (98 kg)  SpO2: 99%  BMI (Calculated): 31.88    Physical Exam Vitals and nursing note reviewed.  Constitutional:      Appearance: Normal appearance. She is normal weight.  HENT:     Head: Normocephalic.  Eyes:     Pupils: Pupils are equal, round, and reactive to light.  Cardiovascular:     Rate and Rhythm: Normal rate.  Pulmonary:     Effort: Pulmonary effort is normal.  Neurological:     Mental Status: She is alert.      Results for orders placed or performed in visit on 04/08/23  POCT CBG (Fasting - Glucose)  Result Value Ref Range   Glucose Fasting, POC 97 70 - 99 mg/dL    Recent Results (from the past 2160 hour(s))  CMP14+EGFR     Status: Abnormal   Collection Time: 02/06/23  9:11 AM  Result Value Ref Range   Glucose 108 (H) 70 - 99 mg/dL   BUN 12 6 - 24 mg/dL   Creatinine, Ser 1.61 0.57 - 1.00 mg/dL   eGFR 096 >04 VW/UJW/1.19   BUN/Creatinine Ratio 20 9 - 23    Sodium 143 134 - 144 mmol/L   Potassium 3.6 3.5 - 5.2 mmol/L   Chloride 103 96 - 106 mmol/L   CO2 22 20 - 29 mmol/L   Calcium 9.1 8.7 - 10.2 mg/dL   Total Protein 6.6 6.0 - 8.5 g/dL   Albumin 4.3 3.9 - 4.9 g/dL   Globulin, Total 2.3 1.5 - 4.5 g/dL   Albumin/Globulin Ratio 1.9 1.2 - 2.2   Bilirubin Total 0.3 0.0 - 1.2 mg/dL   Alkaline Phosphatase 65 44 - 121 IU/L   AST 9 0 - 40 IU/L   ALT 8 0 - 32 IU/L  CBC With Differential     Status: None   Collection Time: 02/06/23  9:11 AM  Result Value Ref Range   WBC 6.3 3.4 - 10.8 x10E3/uL   RBC 3.88 3.77 - 5.28 x10E6/uL   Hemoglobin 11.9 11.1 - 15.9 g/dL   Hematocrit 14.7 82.9 - 46.6 %   MCV 94 79 - 97 fL   MCH 30.7 26.6 - 33.0 pg   MCHC 32.7 31.5 - 35.7 g/dL   RDW 56.2 13.0 - 86.5 %   Neutrophils 59 Not Estab. %   Lymphs 29 Not Estab. %   Monocytes 8 Not Estab. %   Eos 3 Not Estab. %   Basos 1 Not Estab. %   Neutrophils Absolute 3.7 1.4 - 7.0 x10E3/uL   Lymphocytes Absolute 1.8 0.7 - 3.1 x10E3/uL   Monocytes Absolute 0.5 0.1 - 0.9 x10E3/uL   EOS (ABSOLUTE) 0.2 0.0 - 0.4 x10E3/uL   Basophils Absolute 0.0 0.0 - 0.2 x10E3/uL   Immature Granulocytes 0 Not Estab. %   Immature Grans (Abs) 0.0 0.0 - 0.1 x10E3/uL  Hemoglobin A1c     Status: Abnormal   Collection Time: 02/06/23  9:11 AM  Result Value Ref Range   Hgb A1c MFr Bld 5.8 (H) 4.8 - 5.6 %    Comment:          Prediabetes: 5.7 - 6.4          Diabetes: >6.4          Glycemic control for adults with diabetes: <7.0    Est. average glucose Bld gHb Est-mCnc 120 mg/dL  Lipid panel     Status: None   Collection Time: 02/06/23  9:11 AM  Result  Value Ref Range   Cholesterol, Total 168 100 - 199 mg/dL   Triglycerides 161 0 - 149 mg/dL   HDL 52 >09 mg/dL   VLDL Cholesterol Cal 21 5 - 40 mg/dL   LDL Chol Calc (NIH) 95 0 - 99 mg/dL   Chol/HDL Ratio 3.2 0.0 - 4.4 ratio    Comment:                                   T. Chol/HDL Ratio                                             Men   Women                               1/2 Avg.Risk  3.4    3.3                                   Avg.Risk  5.0    4.4                                2X Avg.Risk  9.6    7.1                                3X Avg.Risk 23.4   11.0   Vitamin D (25 hydroxy)     Status: None   Collection Time: 02/06/23  9:11 AM  Result Value Ref Range   Vit D, 25-Hydroxy 30.6 30.0 - 100.0 ng/mL    Comment: Vitamin D deficiency has been defined by the Institute of Medicine and an Endocrine Society practice guideline as a level of serum 25-OH vitamin D less than 20 ng/mL (1,2). The Endocrine Society went on to further define vitamin D insufficiency as a level between 21 and 29 ng/mL (2). 1. IOM (Institute of Medicine). 2010. Dietary reference    intakes for calcium and D. [place]: The    [employer]. 2. Holick MF, Binkley [place], Bischoff-Ferrari HA, et al.    Evaluation, treatment, and prevention of vitamin D    deficiency: an Endocrine Society clinical practice    guideline. JCEM. 2011 Jul; 96(7):1911-30.   Vitamin B12     Status: Abnormal   Collection Time: 02/06/23  9:11 AM  Result Value Ref Range   Vitamin B-12 218 (L) 232 - 1,245 pg/mL  TSH     Status: None   Collection Time: 02/06/23  9:11 AM  Result Value Ref Range   TSH 1.360 0.450 - 4.500 uIU/mL  POC CREATINE & ALBUMIN,URINE     Status: Normal   Collection Time: 02/06/23 10:52 AM  Result Value Ref Range   Microalbumin Ur, POC 30 mg/L   Creatinine, POC 300 mg/dL   Albumin/Creatinine Ratio, Urine, POC <30   POCT CBG (Fasting - Glucose)     Status: Normal   Collection Time: 04/08/23  8:53 AM  Result Value Ref Range   Glucose Fasting, POC 97 70 - 99 mg/dL       Assessment & Plan:  Problem List Items Addressed This Visit       Active Problems   Vitamin B 12 deficiency    B12 Injection in office today.  Come to office for repeat injection in 1 month.       Essential hypertension, benign    Blood pressure well  controlled with current medications.  Continue current therapy.  Will reassess at follow up.        Generalized anxiety disorder    Increasing Escitalopram dose.  Pt. Denies any significant anxiety symptoms, but her depressive symptoms today are worse.   Will reassess at follow up.       Relevant Medications   escitalopram (LEXAPRO) 20 MG tablet   Migraine without aura and without status migrainosus, not intractable (Chronic)    Patient states that the Bernita Raisin has continued helping her headaches.  She is feeling much better overall and will continue these as needed.        Relevant Medications   escitalopram (LEXAPRO) 20 MG tablet   UBRELVY 100 MG TABS   Mixed hyperlipidemia    Checking labs today.  Continue current therapy for lipid control. Will modify as needed based on labwork results.       Type 2 diabetes, controlled, with neuropathy (HCC)    Checking labs today. Will call pt. With results  Patient has not been taking her farxiga, so will give her some Jardiance to try.  She will let me know if this is better, as she was having side effects from the [place].    Continue current diabetes POC, as patient has been well controlled on current regimen.  Will adjust meds if needed based on labs.        Chronic diastolic heart failure (HCC)    Pt. Is managed by cardiology.  Per Dr. Welton Flakes at last visit, patient is doing well from a cardiac perspective.   Will defer to them for further changes of treatment plan.        Vitamin D deficiency, unspecified    Checking labs today.  Will continue supplements as needed.        Major depressive disorder, recurrent episode, moderate (HCC)    Increasing Escitalopram dose.  Pt. Denies any significant anxiety symptoms, but her depressive symptoms today are worse.   Will reassess at follow up.       Relevant Medications   escitalopram (LEXAPRO) 20 MG tablet   Other Visit Diagnoses     Type 2 diabetes mellitus with  hyperglycemia, without long-term current use of insulin (HCC)    -  Primary   Relevant Medications   cyanocobalamin (VITAMIN B12) injection 1,000 mcg (Completed)   Other Relevant Orders   POCT CBG (Fasting - Glucose) (Completed)   Hypothyroidism (acquired)           Return in about 4 months (around 08/08/2023) for F/U.   Total time spent: 30 minutes  Miki Kins, FNP  04/08/2023   This document may have been prepared by [facility] Voice Recognition software and as such may include unintentional dictation errors.

## 2023-04-08 NOTE — Assessment & Plan Note (Signed)
Checking labs today.  Continue current therapy for lipid control. Will modify as needed based on labwork results.  

## 2023-04-08 NOTE — Assessment & Plan Note (Signed)
Patient states that the Ashley Mullen has continued helping her headaches.  She is feeling much better overall and will continue these as needed.

## 2023-04-08 NOTE — Assessment & Plan Note (Signed)
B12 Injection in office today.  Come to office for repeat injection in 1 month.

## 2023-04-08 NOTE — Assessment & Plan Note (Signed)
Increasing Escitalopram dose.  Pt. Denies any significant anxiety symptoms, but her depressive symptoms today are worse.   Will reassess at follow up.

## 2023-04-08 NOTE — Assessment & Plan Note (Signed)
Checking labs today.  Will continue supplements as needed.  

## 2023-04-24 ENCOUNTER — Ambulatory Visit
Admission: RE | Admit: 2023-04-24 | Discharge: 2023-04-24 | Disposition: A | Discharge status: Home / Self Care | Payer: Medicaid Other | Source: Ambulatory Visit | Attending: Family | Admitting: Family

## 2023-04-24 ENCOUNTER — Telehealth: Payer: Self-pay | Admitting: Family

## 2023-04-24 DIAGNOSIS — R928 Other abnormal and inconclusive findings on diagnostic imaging of breast: Secondary | ICD-10-CM | POA: Diagnosis not present

## 2023-04-24 NOTE — Telephone Encounter (Signed)
Patient left VM requesting Jardiance refill be sent to the pharmacy. Please advise.

## 2023-04-29 ENCOUNTER — Other Ambulatory Visit: Payer: Self-pay | Admitting: Family

## 2023-05-12 ENCOUNTER — Other Ambulatory Visit: Payer: Self-pay

## 2023-05-12 MED ORDER — EMPAGLIFLOZIN 25 MG PO TABS: 25.0000 mg | ORAL_TABLET | Freq: Every day | ORAL | 10 refills | Status: AC

## 2023-05-13 ENCOUNTER — Other Ambulatory Visit: Payer: Self-pay | Admitting: Family

## 2023-06-06 ENCOUNTER — Other Ambulatory Visit: Payer: Self-pay | Admitting: Family

## 2023-06-08 ENCOUNTER — Other Ambulatory Visit: Payer: Self-pay | Admitting: Family

## 2023-06-12 ENCOUNTER — Other Ambulatory Visit: Payer: Medicaid Other

## 2023-06-12 DIAGNOSIS — E782 Mixed hyperlipidemia: Secondary | ICD-10-CM

## 2023-06-12 DIAGNOSIS — E1165 Type 2 diabetes mellitus with hyperglycemia: Secondary | ICD-10-CM

## 2023-06-12 DIAGNOSIS — I10 Essential (primary) hypertension: Secondary | ICD-10-CM

## 2023-06-12 DIAGNOSIS — E669 Obesity, unspecified: Secondary | ICD-10-CM

## 2023-06-12 DIAGNOSIS — E039 Hypothyroidism, unspecified: Secondary | ICD-10-CM

## 2023-07-04 ENCOUNTER — Other Ambulatory Visit: Payer: Self-pay | Admitting: Family

## 2023-07-07 ENCOUNTER — Other Ambulatory Visit: Payer: Self-pay | Admitting: Family

## 2023-07-10 ENCOUNTER — Telehealth: Payer: Self-pay

## 2023-07-14 NOTE — Telephone Encounter (Signed)
Pt notified and Rx picked up.

## 2023-08-02 ENCOUNTER — Other Ambulatory Visit: Payer: Self-pay | Admitting: Family

## 2023-08-04 ENCOUNTER — Other Ambulatory Visit: Payer: Self-pay | Admitting: Family

## 2023-08-05 ENCOUNTER — Ambulatory Visit: Payer: Medicaid Other | Admitting: Family

## 2023-09-29 ENCOUNTER — Other Ambulatory Visit: Payer: Self-pay | Admitting: Family

## 2023-09-29 DIAGNOSIS — F411 Generalized anxiety disorder: Secondary | ICD-10-CM

## 2023-10-07 ENCOUNTER — Emergency Department
Admission: EM | Admit: 2023-10-07 | Discharge: 2023-10-07 | Disposition: A | Discharge status: Home / Self Care | Payer: Medicaid Other | Attending: Student in an Organized Health Care Education/Training Program | Admitting: Student in an Organized Health Care Education/Training Program

## 2023-10-07 ENCOUNTER — Other Ambulatory Visit: Payer: Self-pay

## 2023-10-07 DIAGNOSIS — U071 COVID-19: Secondary | ICD-10-CM | POA: Diagnosis not present

## 2023-10-07 DIAGNOSIS — J069 Acute upper respiratory infection, unspecified: Secondary | ICD-10-CM | POA: Insufficient documentation

## 2023-10-07 DIAGNOSIS — R059 Cough, unspecified: Secondary | ICD-10-CM | POA: Diagnosis present

## 2023-10-07 LAB — SARS CORONAVIRUS 2 BY RT PCR: SARS Coronavirus 2 by RT PCR: POSITIVE — AB

## 2023-10-07 NOTE — ED Provider Notes (Signed)
[facility] Provider Note    Event Date/Time   First MD Initiated Contact with Patient 10/07/23 [phone]     (approximate)   History   No chief complaint on file.   HPI  Ashley Mullen is a 47 y.o. female no significant past medical history presents to the ER for evaluation of cough congestion body aches fever yesterday symptoms started after Thanksgiving.  Her granddaughter tested positive for COVID last night and she was with the patient all through the holidays.  They have had their vaccination.  Denies any other symptoms or concerns.     Physical Exam   Triage Vital Signs: ED Triage Vitals  Encounter Vitals Group     BP 10/07/23 0714 (!) 153/92     Systolic BP Percentile --      Diastolic BP Percentile --      Pulse Rate 10/07/23 0714 91     Resp 10/07/23 0714 16     Temp 10/07/23 0714 99 F (37.2 C)     Temp Source 10/07/23 0714 Oral     SpO2 10/07/23 0714 97 %     Weight 10/07/23 0713 222 lb 10.6 oz (101 kg)     Height --      Head Circumference --      Peak Flow --      Pain Score 10/07/23 0713 8     Pain Loc --      Pain Education --      Exclude from Growth Chart --     Most recent vital signs: Vitals:   10/07/23 0714  BP: (!) 153/92  Pulse: 91  Resp: 16  Temp: 99 F (37.2 C)  SpO2: 97%     Constitutional: Alert  Eyes: Conjunctivae are normal.  Head: Atraumatic. Nose: No congestion/rhinnorhea. Mouth/Throat: Mucous membranes are moist.   Neck: Painless ROM.  Cardiovascular:   Good peripheral circulation. Respiratory: Normal respiratory effort.  No retractions.  Gastrointestinal: Soft and nontender.  Musculoskeletal:  no deformity Neurologic:  MAE spontaneously. No gross focal neurologic deficits are appreciated.  Skin:  Skin is warm, dry and intact. No rash noted. Psychiatric: Mood and affect are normal. Speech and behavior are normal.    ED Results / Procedures / Treatments   Labs (all labs ordered are listed,  but only abnormal results are displayed) Labs Reviewed  SARS CORONAVIRUS 2 BY RT PCR - Abnormal; Notable for the following components:      Result Value   SARS Coronavirus 2 by RT PCR POSITIVE (*)    All other components within normal limits     EKG     RADIOLOGY    PROCEDURES:  Critical Care performed:   Procedures   MEDICATIONS ORDERED IN ED: Medications - No data to display   IMPRESSION / MDM / ASSESSMENT AND PLAN / ED COURSE  I reviewed the triage vital signs and the nursing notes.                              Differential diagnosis includes, but is not limited to, viral illness, COVID, pneumonia, flu, sepsis  Presented the ER for evaluation of flulike illness.  Most clinic consistent with viral illness.  Appears well-appearing and nontoxic.  Do not feel that further diagnostic testing clinically indicated.  Viral panel sent.  Lungs are clear.  Not consistent with pneumonia at this time.  They are tolerating p.o.  Is appropriate  for outpatient follow-up.       FINAL CLINICAL IMPRESSION(S) / ED DIAGNOSES   Final diagnoses:  Upper respiratory tract infection, unspecified type  COVID-19     Rx / DC Orders   ED Discharge Orders     None        Note:  This document was prepared using Dragon voice recognition software and may include unintentional dictation errors.    Willy Eddy, MD 10/07/23 [phone]

## 2023-10-07 NOTE — ED Triage Notes (Signed)
C/O runny nose, fever, cough, body aches, headaches since Thanksgiving.  Granddaughter tested positive for COVID yesterday.

## 2023-10-21 ENCOUNTER — Other Ambulatory Visit: Payer: Self-pay | Admitting: Family

## 2023-11-13 ENCOUNTER — Other Ambulatory Visit: Payer: Self-pay | Admitting: Family

## 2023-11-30 ENCOUNTER — Other Ambulatory Visit: Payer: Self-pay | Admitting: Family

## 2023-11-30 DIAGNOSIS — G43009 Migraine without aura, not intractable, without status migrainosus: Secondary | ICD-10-CM

## 2023-12-02 ENCOUNTER — Telehealth: Payer: Self-pay | Admitting: Family

## 2023-12-02 DIAGNOSIS — G43009 Migraine without aura, not intractable, without status migrainosus: Secondary | ICD-10-CM

## 2023-12-02 MED ORDER — IBUPROFEN 800 MG PO TABS: 800.0000 mg | ORAL_TABLET | Freq: Three times a day (TID) | ORAL | 0 refills | Status: DC | PRN

## 2023-12-02 MED ORDER — ACCU-CHEK GUIDE TEST VI STRP: ORAL_STRIP | 12 refills | Status: AC

## 2023-12-02 MED ORDER — UBRELVY 100 MG PO TABS: 100.0000 mg | ORAL_TABLET | ORAL | 3 refills | Status: DC | PRN

## 2023-12-02 NOTE — Telephone Encounter (Signed)
Patient left VM requesting refills. Did not state what meds she needs at this time.

## 2023-12-08 ENCOUNTER — Encounter: Payer: Self-pay | Admitting: Family

## 2023-12-08 ENCOUNTER — Ambulatory Visit: Payer: Medicaid Other | Admitting: Family

## 2023-12-08 VITALS — BP 142/84 | HR 92 | Ht 69.0 in | Wt 223.2 lb

## 2023-12-08 DIAGNOSIS — E782 Mixed hyperlipidemia: Secondary | ICD-10-CM

## 2023-12-08 DIAGNOSIS — J849 Interstitial pulmonary disease, unspecified: Secondary | ICD-10-CM | POA: Insufficient documentation

## 2023-12-08 DIAGNOSIS — E1140 Type 2 diabetes mellitus with diabetic neuropathy, unspecified: Secondary | ICD-10-CM

## 2023-12-08 DIAGNOSIS — I10 Essential (primary) hypertension: Secondary | ICD-10-CM

## 2023-12-08 DIAGNOSIS — E559 Vitamin D deficiency, unspecified: Secondary | ICD-10-CM

## 2023-12-08 DIAGNOSIS — I5032 Chronic diastolic (congestive) heart failure: Secondary | ICD-10-CM

## 2023-12-08 DIAGNOSIS — R5383 Other fatigue: Secondary | ICD-10-CM

## 2023-12-08 DIAGNOSIS — E538 Deficiency of other specified B group vitamins: Secondary | ICD-10-CM

## 2023-12-08 DIAGNOSIS — F331 Major depressive disorder, recurrent, moderate: Secondary | ICD-10-CM

## 2023-12-08 MED ORDER — MOUNJARO 5 MG/0.5ML ~~LOC~~ SOAJ: 5.0000 mg | SUBCUTANEOUS | 3 refills | Status: DC

## 2023-12-08 NOTE — Assessment & Plan Note (Signed)
 Patient stable.  Well controlled with current therapy.   Continue current meds.

## 2023-12-08 NOTE — Assessment & Plan Note (Signed)
 Checking labs today.  Will continue supplements as needed.

## 2023-12-08 NOTE — Assessment & Plan Note (Signed)
 Blood pressure well controlled with current medications.  Continue current therapy.  Will reassess at follow up.

## 2023-12-08 NOTE — Assessment & Plan Note (Signed)
 Patient is seen by Pulmonology, who manage this condition.  She is well controlled with current therapy.   Will defer to them for further changes to plan of care

## 2023-12-08 NOTE — Assessment & Plan Note (Signed)
Pt. Is managed by cardiology.  Per Dr. Welton Flakes at last visit, patient is doing well from a cardiac perspective.   Will defer to them for further changes of treatment plan.

## 2023-12-08 NOTE — Progress Notes (Signed)
 Established Patient Office Visit  Subjective:  Patient ID: Ashley Mullen, female    DOB: 06-30-1976  Age: 48 y.o. MRN: [id number]  Chief Complaint  Patient presents with   Follow-up    Patient is here today for her follow up appointment.  She has been feeling fairly well since last appointment.   She does have additional concerns to discuss today.  Had COVID at the beginning of December.   Labs are due today. She needs refills.   I have reviewed her active problem list, medication list, allergies, notes from last encounter, lab results for her appointment today.      No other concerns at this time.   Past Medical History:  Diagnosis Date   Anxiety    Aortic valve regurgitation    Arthritis    OSTEOARTHRITIS   Asthma    CAD (coronary artery disease)    Chronic diastolic heart failure (HCC)    Depression    Diabetes mellitus without complication (HCC)    GERD (gastroesophageal reflux disease)    Hyperlipidemia    Hypertension    Lung nodules    Mitral regurgitation     Past Surgical History:  Procedure Laterality Date   BILATERAL SALPINGECTOMY Bilateral 12/14/2020   Procedure: BILATERAL SALPINGECTOMY;  Surgeon: Schermerhorn, Debby PARAS, MD;  Location: [facility] ORS;  Service: Gynecology;  Laterality: Bilateral;   CHOLECYSTECTOMY     TUBAL LIGATION  2011   VAGINAL HYSTERECTOMY N/A 12/14/2020   Procedure: HYSTERECTOMY VAGINAL;  Surgeon: Lovetta Debby PARAS, MD;  Location: [facility] ORS;  Service: Gynecology;  Laterality: N/A;   WISDOM TOOTH EXTRACTION      Social History   Socioeconomic History   Marital status: Single    Spouse name: Not on file   Number of children: 2   Years of education: Not on file   Highest education level: Not on file  Occupational History   Not on file  Tobacco Use   Smoking status: Never   Smokeless tobacco: Never  Substance and Sexual Activity   Alcohol use: Yes    Alcohol/week: 2.0 standard drinks of alcohol    Types: 1 Cans of  beer, 1 Glasses of wine per week    Comment: 1-2   Drug use: Never   Sexual activity: Not on file  Other Topics Concern   Not on file  Social History Narrative   Not on file   Social Drivers of Health   Financial Resource Strain: Not on file  Food Insecurity: Not on file  Transportation Needs: Not on file  Physical Activity: Not on file  Stress: Not on file  Social Connections: Not on file  Intimate Partner Violence: Not on file    Family History  Problem Relation Age of Onset   Breast cancer Mother        42's   Bladder Cancer Father    Lung cancer Maternal Grandmother    Breast cancer Paternal Aunt     No Known Allergies  Review of Systems  All other systems reviewed and are negative.      Objective:   BP (!) 142/84   Pulse 92   Ht 5' 9 (1.753 m)   Wt 223 lb 3.2 oz (101.2 kg)   LMP 03/27/2021   SpO2 98%   BMI 32.96 kg/m   Vitals:   12/08/23 0853  BP: (!) 142/84  Pulse: 92  Height: 5' 9 (1.753 m)  Weight: 223 lb 3.2 oz (101.2 kg)  SpO2: 98%  BMI (Calculated): 32.95    Physical Exam Vitals and nursing note reviewed.  Constitutional:      Appearance: Normal appearance. She is normal weight.  HENT:     Head: Normocephalic.  Eyes:     Extraocular Movements: Extraocular movements intact.     Conjunctiva/sclera: Conjunctivae normal.     Pupils: Pupils are equal, round, and reactive to light.  Cardiovascular:     Rate and Rhythm: Normal rate.  Pulmonary:     Effort: Pulmonary effort is normal.  Neurological:     General: No focal deficit present.     Mental Status: She is alert and oriented to person, place, and time. Mental status is at baseline.  Psychiatric:        Mood and Affect: Mood normal.        Behavior: Behavior normal.        Thought Content: Thought content normal.        Judgment: Judgment normal.      No results found for any visits on 12/08/23.  Recent Results (from the past 2160 hours)  SARS Coronavirus 2 by RT PCR  (hospital order, performed in [facility] hospital lab) *cepheid single result test* Anterior Nasal Swab     Status: Abnormal   Collection Time: 10/07/23  7:15 AM   Specimen: Anterior Nasal Swab  Result Value Ref Range   SARS Coronavirus 2 by RT PCR POSITIVE (A) NEGATIVE    Comment: (NOTE) SARS-CoV-2 target nucleic acids are DETECTED  SARS-CoV-2 RNA is generally detectable in upper respiratory specimens  during the acute phase of infection.  Positive results are indicative  of the presence of the identified virus, but do not rule out bacterial infection or co-infection with other pathogens not detected by the test.  Clinical correlation with patient history and  other diagnostic information is necessary to determine patient infection status.  The expected result is negative.  Fact Sheet for Patients:   roadlaptop.co.za   Fact Sheet for Healthcare Providers:   http://kim-miller.com/    This test is not yet approved or cleared by the [place]  FDA and  has been authorized for detection and/or diagnosis of SARS-CoV-2 by FDA under an Emergency Use Authorization (EUA).  This EUA will remain in effect (meaning this test can be used) for the duration of  the COVID-19 declaration under Section 564(b)(1)  of the Act, 21 U.S.C. section 360-bbb-3(b)(1), unless the authorization is terminated or revoked sooner.   Performed at [facility], [address] [place], [place] [address]        Assessment & Plan:   Problem List Items Addressed This Visit       Cardiovascular and Mediastinum   Essential hypertension, benign   Blood pressure well controlled with current medications.  Continue current therapy.  Will reassess at follow up.        Relevant Orders   CMP14+EGFR   CBC with Diff   Chronic diastolic heart failure (HCC)   Pt. Is managed by cardiology.  Per Dr. Fernand at last visit, patient is doing well from a cardiac  perspective.   Will defer to them for further changes of treatment plan.          Respiratory   Interstitial pulmonary disease (HCC)   Patient is seen by Pulmonology, who manage this condition.  She is well controlled with current therapy.   Will defer to them for further changes to plan of care.  Endocrine   Type 2 diabetes, controlled, with neuropathy (HCC) - Primary   Checking labs today. Will call pt. With results  Switching to Mounjaro , as patient has been well controlled on current regimen.  Will adjust meds if needed based on labs.        Relevant Medications   tirzepatide  (MOUNJARO ) 5 MG/0.5ML Pen   Other Relevant Orders   CMP14+EGFR   Hemoglobin A1c   CBC with Diff     Other   Vitamin B 12 deficiency   Checking labs today.  Will continue supplements as needed.        Relevant Orders   CMP14+EGFR   Vitamin B12   CBC with Diff   Mixed hyperlipidemia   Checking labs today.  Continue current therapy for lipid control. Will modify as needed based on labwork results.        Relevant Orders   Lipid panel   CMP14+EGFR   CBC with Diff   Vitamin D  deficiency, unspecified   Checking labs today.  Will continue supplements as needed.        Relevant Orders   VITAMIN D  25 Hydroxy (Vit-D Deficiency, Fractures)   CMP14+EGFR   CBC with Diff   Major depressive disorder, recurrent episode, moderate (HCC)   Patient stable.  Well controlled with current therapy.   Continue current meds.        Other Visit Diagnoses       Other fatigue       Relevant Orders   CMP14+EGFR   TSH   CBC with Diff       Return in about 3 months (around 03/06/2024) for F/U.   Total time spent: 30 minutes  ALAN CHRISTELLA ARRANT, FNP  12/08/2023   This document may have been prepared by [facility] Voice Recognition software and as such may include unintentional dictation errors.

## 2023-12-08 NOTE — Assessment & Plan Note (Signed)
 Checking labs today.  Continue current therapy for lipid control. Will modify as needed based on labwork results.

## 2023-12-08 NOTE — Assessment & Plan Note (Signed)
Checking labs today. Will call pt. With results  Switching to [facility], as patient has been well controlled on current regimen.  Will adjust meds if needed based on labs.

## 2023-12-09 LAB — CBC WITH DIFFERENTIAL/PLATELET
Basophils Absolute: 0 10*3/uL (ref 0.0–0.2)
Basos: 1 %
EOS (ABSOLUTE): 0.2 10*3/uL (ref 0.0–0.4)
Eos: 4 %
Hematocrit: 36.8 % (ref 34.0–46.6)
Hemoglobin: 12.2 g/dL (ref 11.1–15.9)
Immature Grans (Abs): 0 10*3/uL (ref 0.0–0.1)
Immature Granulocytes: 0 %
Lymphocytes Absolute: 1.4 10*3/uL (ref 0.7–3.1)
Lymphs: 24 %
MCH: 31 pg (ref 26.6–33.0)
MCHC: 33.2 g/dL (ref 31.5–35.7)
MCV: 94 fL (ref 79–97)
Monocytes Absolute: 0.5 10*3/uL (ref 0.1–0.9)
Monocytes: 9 %
Neutrophils Absolute: 3.8 10*3/uL (ref 1.4–7.0)
Neutrophils: 62 %
Platelets: 291 10*3/uL (ref 150–450)
RBC: 3.93 x10E6/uL (ref 3.77–5.28)
RDW: 11.9 % (ref 11.7–15.4)
WBC: 6 10*3/uL (ref 3.4–10.8)

## 2023-12-09 LAB — CMP14+EGFR
ALT: 7 [IU]/L (ref 0–32)
AST: 14 [IU]/L (ref 0–40)
Albumin: 4.3 g/dL (ref 3.9–4.9)
Alkaline Phosphatase: 68 [IU]/L (ref 44–121)
BUN/Creatinine Ratio: 18 (ref 9–23)
BUN: 12 mg/dL (ref 6–24)
Bilirubin Total: 0.5 mg/dL (ref 0.0–1.2)
CO2: 24 mmol/L (ref 20–29)
Calcium: 8.9 mg/dL (ref 8.7–10.2)
Chloride: 105 mmol/L (ref 96–106)
Creatinine, Ser: 0.66 mg/dL (ref 0.57–1.00)
Globulin, Total: 2.3 g/dL (ref 1.5–4.5)
Glucose: 97 mg/dL (ref 70–99)
Potassium: 3.8 mmol/L (ref 3.5–5.2)
Sodium: 142 mmol/L (ref 134–144)
Total Protein: 6.6 g/dL (ref 6.0–8.5)
eGFR: 109 mL/min/{1.73_m2} (ref >59)

## 2023-12-09 LAB — LIPID PANEL
Chol/HDL Ratio: 3.6 {ratio} (ref 0.0–4.4)
Cholesterol, Total: 180 mg/dL (ref 100–199)
HDL: 50 mg/dL (ref >39)
LDL Chol Calc (NIH): 106 mg/dL — ABNORMAL HIGH (ref 0–99)
Triglycerides: 133 mg/dL (ref 0–149)
VLDL Cholesterol Cal: 24 mg/dL (ref 5–40)

## 2023-12-09 LAB — VITAMIN B12: Vitamin B-12: 206 pg/mL — ABNORMAL LOW (ref 232–1245)

## 2023-12-09 LAB — HEMOGLOBIN A1C
Est. average glucose Bld gHb Est-mCnc: 123 mg/dL
Hgb A1c MFr Bld: 5.9 % — ABNORMAL HIGH (ref 4.8–5.6)

## 2023-12-09 LAB — TSH: TSH: 1.36 u[IU]/mL (ref 0.450–4.500)

## 2023-12-09 LAB — VITAMIN D 25 HYDROXY (VIT D DEFICIENCY, FRACTURES): Vit D, 25-Hydroxy: 34.9 ng/mL (ref 30.0–100.0)

## 2024-01-01 ENCOUNTER — Other Ambulatory Visit: Payer: Self-pay | Admitting: Family

## 2024-01-01 DIAGNOSIS — F411 Generalized anxiety disorder: Secondary | ICD-10-CM

## 2024-01-14 ENCOUNTER — Other Ambulatory Visit: Payer: Self-pay

## 2024-01-14 MED ORDER — ROSUVASTATIN CALCIUM 40 MG PO TABS: 40.0000 mg | ORAL_TABLET | Freq: Every day | ORAL | 1 refills | Status: DC

## 2024-02-13 ENCOUNTER — Other Ambulatory Visit: Payer: Self-pay | Admitting: Family

## 2024-03-07 ENCOUNTER — Ambulatory Visit: Payer: Medicaid Other | Admitting: Family

## 2024-03-07 ENCOUNTER — Encounter: Payer: Self-pay | Admitting: Family

## 2024-03-07 VITALS — BP 108/68 | HR 83 | Ht 69.0 in | Wt 218.6 lb

## 2024-03-07 DIAGNOSIS — E1140 Type 2 diabetes mellitus with diabetic neuropathy, unspecified: Secondary | ICD-10-CM

## 2024-03-07 DIAGNOSIS — R5383 Other fatigue: Secondary | ICD-10-CM

## 2024-03-07 DIAGNOSIS — E559 Vitamin D deficiency, unspecified: Secondary | ICD-10-CM

## 2024-03-07 DIAGNOSIS — E538 Deficiency of other specified B group vitamins: Secondary | ICD-10-CM

## 2024-03-07 DIAGNOSIS — Z1159 Encounter for screening for other viral diseases: Secondary | ICD-10-CM | POA: Diagnosis not present

## 2024-03-07 DIAGNOSIS — E782 Mixed hyperlipidemia: Secondary | ICD-10-CM

## 2024-03-07 DIAGNOSIS — I10 Essential (primary) hypertension: Secondary | ICD-10-CM

## 2024-03-07 LAB — POC CREATINE & ALBUMIN,URINE
Albumin/Creatinine Ratio, Urine, POC: <30
Creatinine, POC: 200 mg/dL
Microalbumin Ur, POC: 30 mg/L

## 2024-03-07 MED ORDER — MOUNJARO 7.5 MG/0.5ML ~~LOC~~ SOAJ: 7.5000 mg | SUBCUTANEOUS | 0 refills | Status: AC

## 2024-03-07 MED ORDER — MOUNJARO 10 MG/0.5ML ~~LOC~~ SOAJ
10.0000 mg | SUBCUTANEOUS | 2 refills | Status: DC
Start: 2024-04-04 — End: 2024-06-29

## 2024-03-07 NOTE — Assessment & Plan Note (Signed)
 Checking labs today. Will call pt. With results Increasing Mounjaro dose to 7.5 x 1 month, then to 10 mg until follow up.  Will adjust meds if needed based on labs.

## 2024-03-07 NOTE — Assessment & Plan Note (Signed)
 Blood pressure well controlled with current medications.  Continue current therapy.  Will reassess at follow up.

## 2024-03-07 NOTE — Patient Instructions (Signed)
 r

## 2024-03-07 NOTE — Assessment & Plan Note (Signed)
 Checking labs today.  Will continue supplements as needed.

## 2024-03-07 NOTE — Progress Notes (Signed)
 Established Patient Office Visit  Subjective:  Patient ID: Ashley Mullen, female    DOB: 1976/05/05  Age: 48 y.o. MRN: [id number]  Chief Complaint  Patient presents with   Follow-up    3 month follow up    Patient is here today for her follow up appointment.  She has been feeling fairly well since last appointment.   She does not have additional concerns to discuss today.  Needs microalbumin Asks if we can increase her Mounjaro dose, as she has been having some swelling in her knees, which she is attributing to her weight.   Labs are due today. She needs refills.   I have reviewed her active problem list, medication list, allergies, notes from last encounter, lab results for her appointment today.      No other concerns at this time.   Past Medical History:  Diagnosis Date   Anxiety    Aortic valve regurgitation    Arthritis    OSTEOARTHRITIS   Asthma    CAD (coronary artery disease)    Chronic diastolic heart failure (HCC)    Depression    Diabetes mellitus without complication (HCC)    GERD (gastroesophageal reflux disease)    Hyperlipidemia    Hypertension    Lung nodules    Mitral regurgitation     Past Surgical History:  Procedure Laterality Date   BILATERAL SALPINGECTOMY Bilateral 12/14/2020   Procedure: BILATERAL SALPINGECTOMY;  Surgeon: Schermerhorn, Joselyn Nicely, MD;  Location: [facility] ORS;  Service: Gynecology;  Laterality: Bilateral;   CHOLECYSTECTOMY     TUBAL LIGATION  2011   VAGINAL HYSTERECTOMY N/A 12/14/2020   Procedure: HYSTERECTOMY VAGINAL;  Surgeon: Madelene Schanz Joselyn Nicely, MD;  Location: [facility] ORS;  Service: Gynecology;  Laterality: N/A;   WISDOM TOOTH EXTRACTION      Social History   Socioeconomic History   Marital status: Single    Spouse name: Not on file   Number of children: 2   Years of education: Not on file   Highest education level: Not on file  Occupational History   Not on file  Tobacco Use   Smoking status: Never   Smokeless  tobacco: Never  Substance and Sexual Activity   Alcohol use: Yes    Alcohol/week: 2.0 standard drinks of alcohol    Types: 1 Cans of beer, 1 Glasses of wine per week    Comment: 1-2   Drug use: Never   Sexual activity: Not on file  Other Topics Concern   Not on file  Social History Narrative   Not on file   Social Drivers of Health   Financial Resource Strain: Not on file  Food Insecurity: Not on file  Transportation Needs: Not on file  Physical Activity: Not on file  Stress: Not on file  Social Connections: Not on file  Intimate Partner Violence: Not on file    Family History  Problem Relation Age of Onset   Breast cancer Mother        72's   Bladder Cancer Father    Lung cancer Maternal Grandmother    Breast cancer Paternal Aunt     No Known Allergies  Review of Systems  Musculoskeletal:  Positive for joint pain (and swelling in knees bilaterally).  All other systems reviewed and are negative.      Objective:   BP 108/68   Pulse 83   Ht 5\' 9"  (1.753 m)   Wt 218 lb 9.6 oz (99.2 kg)   LMP 03/27/2021  SpO2 99%   BMI 32.28 kg/m   Vitals:   03/07/24 0905  BP: 108/68  Pulse: 83  Height: 5\' 9"  (1.753 m)  Weight: 218 lb 9.6 oz (99.2 kg)  SpO2: 99%  BMI (Calculated): 32.27    Physical Exam Vitals and nursing note reviewed.  Constitutional:      Appearance: Normal appearance. She is normal weight.  HENT:     Head: Normocephalic.  Eyes:     Extraocular Movements: Extraocular movements intact.     Conjunctiva/sclera: Conjunctivae normal.     Pupils: Pupils are equal, round, and reactive to light.  Cardiovascular:     Rate and Rhythm: Normal rate.  Pulmonary:     Effort: Pulmonary effort is normal.  Neurological:     General: No focal deficit present.     Mental Status: She is alert and oriented to person, place, and time. Mental status is at baseline.  Psychiatric:        Mood and Affect: Mood normal.        Behavior: Behavior normal.         Thought Content: Thought content normal.        Judgment: Judgment normal.      Results for orders placed or performed in visit on 03/07/24  POC CREATINE & ALBUMIN,URINE  Result Value Ref Range   Microalbumin Ur, POC 30 mg/L   Creatinine, POC 200 mg/dL   Albumin/Creatinine Ratio, Urine, POC <30     Recent Results (from the past 2160 hours)  Lipid panel     Status: Abnormal   Collection Time: 12/08/23  9:52 AM  Result Value Ref Range   Cholesterol, Total 180 100 - 199 mg/dL   Triglycerides 696 0 - 149 mg/dL   HDL 50 >29 mg/dL   VLDL Cholesterol Cal 24 5 - 40 mg/dL   LDL Chol Calc (NIH) 528 (H) 0 - 99 mg/dL   Chol/HDL Ratio 3.6 0.0 - 4.4 ratio    Comment:                                   T. Chol/HDL Ratio                                             Men  Women                               1/2 Avg.Risk  3.4    3.3                                   Avg.Risk  5.0    4.4                                2X Avg.Risk  9.6    7.1                                3X Avg.Risk 23.4   11.0   VITAMIN D  25 Hydroxy (Vit-D Deficiency, Fractures)     Status: None   Collection Time: 12/08/23  9:52 AM  Result Value Ref Range   Vit D, 25-Hydroxy 34.9 30.0 - 100.0 ng/mL    Comment: Vitamin D  deficiency has been defined by the Institute of Medicine and an Endocrine Society practice guideline as a level of serum 25-OH vitamin D  less than 20 ng/mL (1,2). The Endocrine Society went on to further define vitamin D  insufficiency as a level between 21 and 29 ng/mL (2). 1. IOM (Institute of Medicine). 2010. Dietary reference    intakes for calcium  and D. [place]: The    [employer]. 2. Holick MF, Binkley Fernando Salinas, Bischoff-Ferrari HA, et al.    Evaluation, treatment, and prevention of vitamin D     deficiency: an Endocrine Society clinical practice    guideline. JCEM. 2011 Jul; 96(7):1911-30.   CMP14+EGFR     Status: None   Collection Time: 12/08/23  9:52 AM  Result Value Ref Range    Glucose 97 70 - 99 mg/dL   BUN 12 6 - 24 mg/dL   Creatinine, Ser 1.32 0.57 - 1.00 mg/dL   eGFR 440 >10 UV/OZD/6.64   BUN/Creatinine Ratio 18 9 - 23   Sodium 142 134 - 144 mmol/L   Potassium 3.8 3.5 - 5.2 mmol/L   Chloride 105 96 - 106 mmol/L   CO2 24 20 - 29 mmol/L   Calcium  8.9 8.7 - 10.2 mg/dL   Total Protein 6.6 6.0 - 8.5 g/dL   Albumin 4.3 3.9 - 4.9 g/dL   Globulin, Total 2.3 1.5 - 4.5 g/dL   Bilirubin Total 0.5 0.0 - 1.2 mg/dL   Alkaline Phosphatase 68 44 - 121 IU/L   AST 14 0 - 40 IU/L   ALT 7 0 - 32 IU/L  TSH     Status: None   Collection Time: 12/08/23  9:52 AM  Result Value Ref Range   TSH 1.360 0.450 - 4.500 uIU/mL  Hemoglobin A1c     Status: Abnormal   Collection Time: 12/08/23  9:52 AM  Result Value Ref Range   Hgb A1c MFr Bld 5.9 (H) 4.8 - 5.6 %    Comment:          Prediabetes: 5.7 - 6.4          Diabetes: >6.4          Glycemic control for adults with diabetes: <7.0    Est. average glucose Bld gHb Est-mCnc 123 mg/dL  Vitamin B12     Status: Abnormal   Collection Time: 12/08/23  9:52 AM  Result Value Ref Range   Vitamin B-12 206 (L) 232 - 1,245 pg/mL  CBC with Diff     Status: None   Collection Time: 12/08/23  9:52 AM  Result Value Ref Range   WBC 6.0 3.4 - 10.8 x10E3/uL   RBC 3.93 3.77 - 5.28 x10E6/uL   Hemoglobin 12.2 11.1 - 15.9 g/dL   Hematocrit 40.3 47.4 - 46.6 %   MCV 94 79 - 97 fL   MCH 31.0 26.6 - 33.0 pg   MCHC 33.2 31.5 - 35.7 g/dL   RDW 25.9 56.3 - 87.5 %   Platelets 291 150 - 450 x10E3/uL   Neutrophils 62 Not Estab. %   Lymphs 24 Not Estab. %   Monocytes 9 Not Estab. %   Eos 4 Not Estab. %   Basos 1 Not Estab. %   Neutrophils Absolute 3.8 1.4 - 7.0 x10E3/uL   Lymphocytes Absolute 1.4 0.7 - 3.1 x10E3/uL   Monocytes Absolute 0.5 0.1 - 0.9  x10E3/uL   EOS (ABSOLUTE) 0.2 0.0 - 0.4 x10E3/uL   Basophils Absolute 0.0 0.0 - 0.2 x10E3/uL   Immature Granulocytes 0 Not Estab. %   Immature Grans (Abs) 0.0 0.0 - 0.1 x10E3/uL  POC CREATINE &  ALBUMIN,URINE     Status: Normal   Collection Time: 03/07/24  9:54 AM  Result Value Ref Range   Microalbumin Ur, POC 30 mg/L   Creatinine, POC 200 mg/dL   Albumin/Creatinine Ratio, Urine, POC <30        Assessment & Plan:   Problem List Items Addressed This Visit       Cardiovascular and Mediastinum   Essential hypertension, benign   Blood pressure well controlled with current medications.  Continue current therapy.  Will reassess at follow up.        Relevant Orders   CMP14+EGFR   CBC with Diff     Endocrine   Type 2 diabetes, controlled, with neuropathy (HCC)   Relevant Medications   tirzepatide (MOUNJARO) 7.5 MG/0.5ML Pen   tirzepatide (MOUNJARO) 10 MG/0.5ML Pen (Start on 04/04/2024)   Other Relevant Orders   CMP14+EGFR   CBC with Diff   Hemoglobin A1c   Microalbumin / Creatinine Urine Ratio   POC CREATINE & ALBUMIN,URINE (Completed)     Other   Vitamin B 12 deficiency   Checking labs today.  Will continue supplements as needed.        Relevant Orders   CMP14+EGFR   Vitamin B12   CBC with Diff   Mixed hyperlipidemia   Relevant Orders   CMP14+EGFR   Lipid panel   CBC with Diff   Vitamin D  deficiency, unspecified   Checking labs today.  Will continue supplements as needed.        Relevant Orders   CMP14+EGFR   VITAMIN D  25 Hydroxy (Vit-D Deficiency, Fractures)   CBC with Diff   Other Visit Diagnoses       Need for hepatitis C screening test    -  Primary   Relevant Orders   Hepatitis C Ab reflex to Quant PCR     Other fatigue       Relevant Orders   CMP14+EGFR   CBC with Diff   TSH       Return in about 3 months (around 06/07/2024).   Total time spent: 30 minutes  Trenda Frisk, FNP  03/07/2024   This document may have been prepared by [facility] Voice Recognition software and as such may include unintentional dictation errors.

## 2024-03-08 LAB — CMP14+EGFR
ALT: 6 IU/L (ref 0–32)
AST: 10 IU/L (ref 0–40)
Albumin: 4.1 g/dL (ref 3.9–4.9)
Alkaline Phosphatase: 62 IU/L (ref 44–121)
BUN/Creatinine Ratio: 27 — ABNORMAL HIGH (ref 9–23)
BUN: 16 mg/dL (ref 6–24)
Bilirubin Total: 0.4 mg/dL (ref 0.0–1.2)
CO2: 22 mmol/L (ref 20–29)
Calcium: 9.1 mg/dL (ref 8.7–10.2)
Chloride: 104 mmol/L (ref 96–106)
Creatinine, Ser: 0.6 mg/dL (ref 0.57–1.00)
Globulin, Total: 2.7 g/dL (ref 1.5–4.5)
Glucose: 92 mg/dL (ref 70–99)
Potassium: 3.3 mmol/L — ABNORMAL LOW (ref 3.5–5.2)
Sodium: 141 mmol/L (ref 134–144)
Total Protein: 6.8 g/dL (ref 6.0–8.5)
eGFR: 112 mL/min/{1.73_m2} (ref >59)

## 2024-03-08 LAB — LIPID PANEL
Chol/HDL Ratio: 3.3 ratio (ref 0.0–4.4)
Cholesterol, Total: 165 mg/dL (ref 100–199)
HDL: 50 mg/dL (ref >39)
LDL Chol Calc (NIH): 94 mg/dL (ref 0–99)
Triglycerides: 119 mg/dL (ref 0–149)
VLDL Cholesterol Cal: 21 mg/dL (ref 5–40)

## 2024-03-08 LAB — CBC WITH DIFFERENTIAL/PLATELET
Basophils Absolute: 0 10*3/uL (ref 0.0–0.2)
Basos: 1 %
EOS (ABSOLUTE): 0.1 10*3/uL (ref 0.0–0.4)
Eos: 3 %
Hematocrit: 36.4 % (ref 34.0–46.6)
Hemoglobin: 11.5 g/dL (ref 11.1–15.9)
Immature Grans (Abs): 0 10*3/uL (ref 0.0–0.1)
Immature Granulocytes: 0 %
Lymphocytes Absolute: 1.6 10*3/uL (ref 0.7–3.1)
Lymphs: 31 %
MCH: 29.8 pg (ref 26.6–33.0)
MCHC: 31.6 g/dL (ref 31.5–35.7)
MCV: 94 fL (ref 79–97)
Monocytes Absolute: 0.4 10*3/uL (ref 0.1–0.9)
Monocytes: 7 %
Neutrophils Absolute: 3 10*3/uL (ref 1.4–7.0)
Neutrophils: 58 %
Platelets: 253 10*3/uL (ref 150–450)
RBC: 3.86 x10E6/uL (ref 3.77–5.28)
RDW: 12 % (ref 11.7–15.4)
WBC: 5.1 10*3/uL (ref 3.4–10.8)

## 2024-03-08 LAB — VITAMIN B12: Vitamin B-12: 275 pg/mL (ref 232–1245)

## 2024-03-08 LAB — TSH: TSH: 1.37 u[IU]/mL (ref 0.450–4.500)

## 2024-03-08 LAB — HEMOGLOBIN A1C
Est. average glucose Bld gHb Est-mCnc: 108 mg/dL
Hgb A1c MFr Bld: 5.4 % (ref 4.8–5.6)

## 2024-03-08 LAB — MICROALBUMIN / CREATININE URINE RATIO
Creatinine, Urine: 150.4 mg/dL
Microalb/Creat Ratio: 6 mg/g{creat} (ref 0–29)
Microalbumin, Urine: 9.3 ug/mL

## 2024-03-08 LAB — VITAMIN D 25 HYDROXY (VIT D DEFICIENCY, FRACTURES): Vit D, 25-Hydroxy: 33.3 ng/mL (ref 30.0–100.0)

## 2024-03-09 LAB — HCV INTERPRETATION

## 2024-03-09 LAB — HCV AB W REFLEX TO QUANT PCR: HCV Ab: NONREACTIVE

## 2024-03-11 NOTE — Progress Notes (Signed)
 Patient notified

## 2024-03-15 ENCOUNTER — Other Ambulatory Visit: Payer: Self-pay | Admitting: Family

## 2024-03-15 DIAGNOSIS — Z1231 Encounter for screening mammogram for malignant neoplasm of breast: Secondary | ICD-10-CM

## 2024-03-22 ENCOUNTER — Other Ambulatory Visit: Payer: Self-pay | Admitting: Family

## 2024-03-22 DIAGNOSIS — G43009 Migraine without aura, not intractable, without status migrainosus: Secondary | ICD-10-CM

## 2024-03-29 ENCOUNTER — Other Ambulatory Visit: Payer: Self-pay | Admitting: Family

## 2024-03-29 DIAGNOSIS — F411 Generalized anxiety disorder: Secondary | ICD-10-CM

## 2024-04-26 ENCOUNTER — Ambulatory Visit
Admission: RE | Admit: 2024-04-26 | Discharge: 2024-04-26 | Disposition: A | Discharge status: Home / Self Care | Source: Ambulatory Visit | Attending: Family | Admitting: Family

## 2024-04-26 DIAGNOSIS — Z1231 Encounter for screening mammogram for malignant neoplasm of breast: Secondary | ICD-10-CM | POA: Insufficient documentation

## 2024-05-03 ENCOUNTER — Other Ambulatory Visit: Payer: Self-pay | Admitting: Family

## 2024-05-03 DIAGNOSIS — G43009 Migraine without aura, not intractable, without status migrainosus: Secondary | ICD-10-CM

## 2024-05-16 ENCOUNTER — Other Ambulatory Visit: Payer: Self-pay | Admitting: Family

## 2024-06-07 ENCOUNTER — Encounter: Payer: Self-pay | Admitting: Family

## 2024-06-07 ENCOUNTER — Ambulatory Visit: Admitting: Family

## 2024-06-07 DIAGNOSIS — E1140 Type 2 diabetes mellitus with diabetic neuropathy, unspecified: Secondary | ICD-10-CM

## 2024-06-07 DIAGNOSIS — I10 Essential (primary) hypertension: Secondary | ICD-10-CM

## 2024-06-07 DIAGNOSIS — E782 Mixed hyperlipidemia: Secondary | ICD-10-CM | POA: Diagnosis not present

## 2024-06-07 DIAGNOSIS — R5383 Other fatigue: Secondary | ICD-10-CM

## 2024-06-07 DIAGNOSIS — E559 Vitamin D deficiency, unspecified: Secondary | ICD-10-CM | POA: Diagnosis not present

## 2024-06-07 DIAGNOSIS — E538 Deficiency of other specified B group vitamins: Secondary | ICD-10-CM

## 2024-06-07 MED ORDER — CYANOCOBALAMIN 1000 MCG/ML IJ SOLN
1000.0000 ug | INTRAMUSCULAR | 3 refills | Status: DC
Start: 2024-06-07 — End: 2024-07-07

## 2024-06-07 MED ORDER — ALBUTEROL SULFATE HFA 108 (90 BASE) MCG/ACT IN AERS: 2.0000 | INHALATION_SPRAY | Freq: Four times a day (QID) | RESPIRATORY_TRACT | 3 refills | Status: DC | PRN

## 2024-06-07 NOTE — Assessment & Plan Note (Signed)
 Checking labs today.  Will continue supplements as needed.   - Vitamin D  - Vitamin B12 - TSH

## 2024-06-07 NOTE — Assessment & Plan Note (Signed)
 Blood pressure well controlled with current medications.  Continue current therapy.  Will reassess at follow up.   - CBC w/Diff - CMP w/eGFR

## 2024-06-07 NOTE — Assessment & Plan Note (Signed)
 Checking labs today. Will call pt. With results  Continue current diabetes POC, as patient has been well controlled on current regimen.  Will adjust meds if needed based on labs.   -CBC w/Diff -CMP w/eGFR -Hemoglobin A1C

## 2024-06-07 NOTE — Assessment & Plan Note (Signed)
 Checking labs today.  Continue current therapy for lipid control. Will modify as needed based on labwork results.   -CMP w/eGFR -Lipid Panel

## 2024-06-07 NOTE — Progress Notes (Signed)
 Established Patient Office Visit  Subjective:  Patient ID: Ashley Mullen, female    DOB: 05/23/1976  Age: 48 y.o. MRN: [id number]  Chief Complaint  Patient presents with   Follow-up    3 month follow up    Patient is here today for her 3 months follow up.  She has been feeling fairly well since last appointment.   She does not have additional concerns to discuss today.  She has had multiple appointments with ortho since her last appointment with me, still having severe pain in her knee.  Also still having trouble with her migraines, says that the Ubrelvy  is not helping as it used to be.  She is still having significant anxiety as well, partially due to dealing with her son and his autism.   Labs are due today.  She needs refills.   I have reviewed her active problem list, medication list, allergies, notes from last encounter, lab results for her appointment today.      No other concerns at this time.   Past Medical History:  Diagnosis Date   Anxiety    Aortic valve regurgitation    Arthritis    OSTEOARTHRITIS   Asthma    CAD (coronary artery disease)    Chronic diastolic heart failure (HCC)    Depression    Diabetes mellitus without complication (HCC)    GERD (gastroesophageal reflux disease)    Hyperlipidemia    Hypertension    Lung nodules    Mitral regurgitation     Past Surgical History:  Procedure Laterality Date   BILATERAL SALPINGECTOMY Bilateral 12/14/2020   Procedure: BILATERAL SALPINGECTOMY;  Surgeon: Schermerhorn, Debby PARAS, MD;  Location: [facility];  Service: Gynecology;  Laterality: Bilateral;   CHOLECYSTECTOMY     TUBAL LIGATION  2011   VAGINAL HYSTERECTOMY N/A 12/14/2020   Procedure: HYSTERECTOMY VAGINAL;  Surgeon: Lovetta Debby PARAS, MD;  Location: [facility];  Service: Gynecology;  Laterality: N/A;   WISDOM TOOTH EXTRACTION      Social History   Socioeconomic History   Marital status: Single    Spouse name: Not on file   Number of  children: 2   Years of education: Not on file   Highest education level: Not on file  Occupational History   Not on file  Tobacco Use   Smoking status: Never   Smokeless tobacco: Never  Substance and Sexual Activity   Alcohol use: Yes    Alcohol/week: 2.0 standard drinks of alcohol    Types: 1 Cans of beer, 1 Glasses of wine per week    Comment: 1-2   Drug use: Never   Sexual activity: Not on file  Other Topics Concern   Not on file  Social History Narrative   Not on file   Social Drivers of Health   Financial Resource Strain: Not on file  Food Insecurity: Not on file  Transportation Needs: Not on file  Physical Activity: Not on file  Stress: Not on file  Social Connections: Not on file  Intimate Partner Violence: Not on file    Family History  Problem Relation Age of Onset   Breast cancer Mother        4's   Bladder Cancer Father    Lung cancer Maternal Grandmother    Breast cancer Paternal Aunt     No Known Allergies  Review of Systems  Musculoskeletal:  Positive for joint pain and myalgias.  Psychiatric/Behavioral:  Positive for depression. The patient is nervous/anxious.  All other systems reviewed and are negative.      Objective:   BP 136/80   Pulse 88   Ht 5' 9 (1.753 m)   Wt 208 lb 9.6 oz (94.6 kg)   LMP 03/27/2021   SpO2 99%   BMI 30.80 kg/m   Vitals:   06/07/24 0901  BP: 136/80  Pulse: 88  Height: 5' 9 (1.753 m)  Weight: 208 lb 9.6 oz (94.6 kg)  SpO2: 99%  BMI (Calculated): 30.79    Physical Exam Vitals and nursing note reviewed.  Constitutional:      Appearance: Normal appearance. She is normal weight.  HENT:     Head: Normocephalic.  Eyes:     Extraocular Movements: Extraocular movements intact.     Conjunctiva/sclera: Conjunctivae normal.     Pupils: Pupils are equal, round, and reactive to light.  Cardiovascular:     Rate and Rhythm: Normal rate.  Pulmonary:     Effort: Pulmonary effort is normal.  Neurological:      General: No focal deficit present.     Mental Status: She is alert and oriented to person, place, and time. Mental status is at baseline.  Psychiatric:        Mood and Affect: Mood normal.        Behavior: Behavior normal.        Thought Content: Thought content normal.      No results found for any visits on 06/07/24.  No results found for this or any previous visit (from the past 2160 hours).     Assessment & Plan Type 2 diabetes, controlled, with neuropathy (HCC) Checking labs today. Will call pt. With results  Continue current diabetes POC, as patient has been well controlled on current regimen.  Will adjust meds if needed based on labs.   -CBC w/Diff -CMP w/eGFR -Hemoglobin A1C  Vitamin D  deficiency, unspecified Vitamin B 12 deficiency Other fatigue Checking labs today.  Will continue supplements as needed.   - Vitamin D  - Vitamin B12 - TSH  Mixed hyperlipidemia Checking labs today.  Continue current therapy for lipid control. Will modify as needed based on labwork results.   -CMP w/eGFR -Lipid Panel  Essential hypertension, benign Blood pressure well controlled with current medications.  Continue current therapy.  Will reassess at follow up.   - CBC w/Diff - CMP w/eGFR     Return in about 3 months (around 09/07/2024) for F/U.   Total time spent: 20 minutes  ALAN CHRISTELLA ARRANT, FNP  06/07/2024   This document may have been prepared by [facility] Voice Recognition software and as such may include unintentional dictation errors.

## 2024-06-09 ENCOUNTER — Other Ambulatory Visit

## 2024-06-09 ENCOUNTER — Ambulatory Visit (INDEPENDENT_AMBULATORY_CARE_PROVIDER_SITE_OTHER): Admitting: Family

## 2024-06-09 DIAGNOSIS — E538 Deficiency of other specified B group vitamins: Secondary | ICD-10-CM

## 2024-06-09 MED ORDER — CYANOCOBALAMIN 1000 MCG/ML IJ SOLN
1000.0000 ug | Freq: Once | INTRAMUSCULAR | Status: AC
  Administered 2024-06-09: 1000 ug via INTRAMUSCULAR

## 2024-06-09 NOTE — Addendum Note (Signed)
 Addended by: ORLEAN PALMA on: 06/09/2024 10:29 AM   Modules accepted: Level of Service

## 2024-06-09 NOTE — Progress Notes (Signed)
   CHIEF COMPLAINT  B12 Shot     REASON FOR VISIT  B12 Injection     ASSESSMENT  B12 Deficiency, Unspecified     PLAN  Diagnoses and all orders for this visit:  Vitamin B 12 deficiency -     cyanocobalamin  (VITAMIN B12) injection 1,000 mcg     Pt. given B12 injection in clinic.  Return for next injection per provider instructions.   Total time spent: 5 minutes  ALAN CHRISTELLA ARRANT, FNP  06/09/2024

## 2024-06-10 LAB — CMP14+EGFR
ALT: 6 IU/L (ref 0–32)
AST: 9 IU/L (ref 0–40)
Albumin: 4.1 g/dL (ref 3.9–4.9)
Alkaline Phosphatase: 56 IU/L (ref 44–121)
BUN/Creatinine Ratio: 23 (ref 9–23)
BUN: 16 mg/dL (ref 6–24)
Bilirubin Total: 0.5 mg/dL (ref 0.0–1.2)
CO2: 22 mmol/L (ref 20–29)
Calcium: 8.8 mg/dL (ref 8.7–10.2)
Chloride: 103 mmol/L (ref 96–106)
Creatinine, Ser: 0.69 mg/dL (ref 0.57–1.00)
Globulin, Total: 2.4 g/dL (ref 1.5–4.5)
Glucose: 85 mg/dL (ref 70–99)
Potassium: 3.5 mmol/L (ref 3.5–5.2)
Sodium: 141 mmol/L (ref 134–144)
Total Protein: 6.5 g/dL (ref 6.0–8.5)
eGFR: 108 mL/min/1.73 (ref >59)

## 2024-06-10 LAB — CBC WITH DIFFERENTIAL/PLATELET
Basophils Absolute: 0 x10E3/uL (ref 0.0–0.2)
Basos: 1 %
EOS (ABSOLUTE): 0.1 x10E3/uL (ref 0.0–0.4)
Eos: 3 %
Hematocrit: 37.6 % (ref 34.0–46.6)
Hemoglobin: 11.8 g/dL (ref 11.1–15.9)
Immature Grans (Abs): 0 x10E3/uL (ref 0.0–0.1)
Immature Granulocytes: 0 %
Lymphocytes Absolute: 1.5 x10E3/uL (ref 0.7–3.1)
Lymphs: 27 %
MCH: 29.8 pg (ref 26.6–33.0)
MCHC: 31.4 g/dL — ABNORMAL LOW (ref 31.5–35.7)
MCV: 95 fL (ref 79–97)
Monocytes Absolute: 0.5 x10E3/uL (ref 0.1–0.9)
Monocytes: 10 %
Neutrophils Absolute: 3.4 x10E3/uL (ref 1.4–7.0)
Neutrophils: 59 %
Platelets: 271 x10E3/uL (ref 150–450)
RBC: 3.96 x10E6/uL (ref 3.77–5.28)
RDW: 12.1 % (ref 11.7–15.4)
WBC: 5.7 x10E3/uL (ref 3.4–10.8)

## 2024-06-10 LAB — LIPID PANEL
Chol/HDL Ratio: 3.2 ratio (ref 0.0–4.4)
Cholesterol, Total: 165 mg/dL (ref 100–199)
HDL: 52 mg/dL (ref >39)
LDL Chol Calc (NIH): 93 mg/dL (ref 0–99)
Triglycerides: 113 mg/dL (ref 0–149)
VLDL Cholesterol Cal: 20 mg/dL (ref 5–40)

## 2024-06-10 LAB — TSH: TSH: 1.7 u[IU]/mL (ref 0.450–4.500)

## 2024-06-10 LAB — VITAMIN B12: Vitamin B-12: 140 pg/mL — ABNORMAL LOW (ref 232–1245)

## 2024-06-10 LAB — VITAMIN D 25 HYDROXY (VIT D DEFICIENCY, FRACTURES): Vit D, 25-Hydroxy: 34.2 ng/mL (ref 30.0–100.0)

## 2024-06-10 LAB — HEMOGLOBIN A1C
Est. average glucose Bld gHb Est-mCnc: 105 mg/dL
Hgb A1c MFr Bld: 5.3 % (ref 4.8–5.6)

## 2024-06-23 ENCOUNTER — Other Ambulatory Visit: Payer: Self-pay | Admitting: Family

## 2024-06-23 DIAGNOSIS — F411 Generalized anxiety disorder: Secondary | ICD-10-CM

## 2024-06-29 ENCOUNTER — Other Ambulatory Visit: Payer: Self-pay | Admitting: Family

## 2024-07-06 ENCOUNTER — Other Ambulatory Visit: Payer: Self-pay | Admitting: Family

## 2024-07-07 ENCOUNTER — Ambulatory Visit: Admitting: Family

## 2024-07-07 DIAGNOSIS — E538 Deficiency of other specified B group vitamins: Secondary | ICD-10-CM

## 2024-07-07 MED ORDER — CYANOCOBALAMIN 1000 MCG/ML IJ SOLN: 1000.0000 ug | INTRAMUSCULAR | 3 refills | Status: DC

## 2024-07-07 MED ORDER — CYANOCOBALAMIN 1000 MCG/ML IJ SOLN
1000.0000 ug | Freq: Once | INTRAMUSCULAR | Status: AC
  Administered 2024-07-07: 1000 ug via INTRAMUSCULAR

## 2024-07-07 NOTE — Progress Notes (Signed)
   CHIEF COMPLAINT  B12 Shot     REASON FOR VISIT  B12 Injection     ASSESSMENT  B12 Deficiency, Unspecified     PLAN  Diagnoses and all orders for this visit:  Vitamin B 12 deficiency -     cyanocobalamin  (VITAMIN B12) injection 1,000 mcg  Other orders -     cyanocobalamin  (VITAMIN B12) 1000 MCG/ML injection; Inject 1 mL (1,000 mcg total) into the muscle every 30 (thirty) days.     Pt. given B12 injection in clinic.  Return for next injection per provider instructions.   Total time spent: 5 minutes  ALAN CHRISTELLA ARRANT, FNP  07/07/2024

## 2024-07-24 ENCOUNTER — Other Ambulatory Visit: Payer: Self-pay | Admitting: Cardiology

## 2024-08-05 ENCOUNTER — Ambulatory Visit: Admitting: Family

## 2024-08-05 ENCOUNTER — Encounter: Payer: Self-pay | Admitting: Family

## 2024-08-05 DIAGNOSIS — E538 Deficiency of other specified B group vitamins: Secondary | ICD-10-CM

## 2024-08-05 MED ORDER — CYANOCOBALAMIN 1000 MCG/ML IJ SOLN
1000.0000 ug | Freq: Once | INTRAMUSCULAR | Status: AC
  Administered 2024-08-05: 1000 ug via INTRAMUSCULAR

## 2024-08-05 NOTE — Progress Notes (Signed)
   CHIEF COMPLAINT  B12 Shot     REASON FOR VISIT  B12 Injection     ASSESSMENT  B12 Deficiency, Unspecified     PLAN  Diagnoses and all orders for this visit:  Vitamin B 12 deficiency -     cyanocobalamin  (VITAMIN B12) injection 1,000 mcg     Pt. given B12 injection in clinic.  Return for next injection per provider instructions.   Total time spent: 5 minutes  ALAN CHRISTELLA ARRANT, FNP  08/05/2024

## 2024-08-18 ENCOUNTER — Other Ambulatory Visit: Payer: Self-pay | Admitting: Family

## 2024-08-20 ENCOUNTER — Other Ambulatory Visit: Payer: Self-pay | Admitting: Family

## 2024-08-30 ENCOUNTER — Other Ambulatory Visit: Payer: Self-pay | Admitting: Family

## 2024-09-07 ENCOUNTER — Encounter: Payer: Self-pay | Admitting: Family

## 2024-09-07 ENCOUNTER — Ambulatory Visit: Admitting: Family

## 2024-09-07 VITALS — BP 122/84 | HR 88 | Ht 69.0 in | Wt 204.8 lb

## 2024-09-07 DIAGNOSIS — R5383 Other fatigue: Secondary | ICD-10-CM | POA: Insufficient documentation

## 2024-09-07 DIAGNOSIS — E538 Deficiency of other specified B group vitamins: Secondary | ICD-10-CM | POA: Diagnosis not present

## 2024-09-07 DIAGNOSIS — E1169 Type 2 diabetes mellitus with other specified complication: Secondary | ICD-10-CM

## 2024-09-07 DIAGNOSIS — E66811 Other obesity due to excess calories: Secondary | ICD-10-CM | POA: Insufficient documentation

## 2024-09-07 DIAGNOSIS — E1140 Type 2 diabetes mellitus with diabetic neuropathy, unspecified: Secondary | ICD-10-CM

## 2024-09-07 DIAGNOSIS — I152 Hypertension secondary to endocrine disorders: Secondary | ICD-10-CM

## 2024-09-07 DIAGNOSIS — E6609 Other obesity due to excess calories: Secondary | ICD-10-CM

## 2024-09-07 DIAGNOSIS — E782 Mixed hyperlipidemia: Secondary | ICD-10-CM

## 2024-09-07 DIAGNOSIS — J3089 Other allergic rhinitis: Secondary | ICD-10-CM

## 2024-09-07 DIAGNOSIS — Z6830 Body mass index (BMI) 30.0-30.9, adult: Secondary | ICD-10-CM

## 2024-09-07 DIAGNOSIS — E559 Vitamin D deficiency, unspecified: Secondary | ICD-10-CM

## 2024-09-07 DIAGNOSIS — E1159 Type 2 diabetes mellitus with other circulatory complications: Secondary | ICD-10-CM

## 2024-09-07 MED ORDER — CYANOCOBALAMIN 1000 MCG/ML IJ SOLN
1000.0000 ug | Freq: Once | INTRAMUSCULAR | Status: AC
  Administered 2024-09-07: 1000 ug via INTRAMUSCULAR

## 2024-09-07 MED ORDER — FLUTICASONE PROPIONATE 50 MCG/ACT NA SUSP: 1.0000 | Freq: Every day | NASAL | 3 refills | Status: AC

## 2024-09-07 NOTE — Progress Notes (Signed)
 Established Patient Office Visit  Subjective:  Patient ID: Ashley Mullen, female    DOB: 01-19-1976  Age: 48 y.o. MRN: [id number]  Chief Complaint  Patient presents with   Follow-up    3 month follow up    Patient is here today for her 3 months follow up.  She has been feeling fairly well since last appointment. She endorses fatigue and has hx of low B12 and needing monthly B12 injections.  She does not have additional concerns to discuss today. She reports knee pain has continued. Needs to follow up. She is due for her B12 injection. Labs are due today.  She does not need refills at this time.  I have reviewed her active problem list, medication list, allergies, family history, social history, previous notes for her appointment today.      No other concerns at this time.   Past Medical History:  Diagnosis Date   Anxiety    Aortic valve regurgitation    Arthritis    OSTEOARTHRITIS   Asthma    CAD (coronary artery disease)    Chronic diastolic heart failure (HCC)    Depression    Diabetes mellitus without complication (HCC)    GERD (gastroesophageal reflux disease)    Hyperlipidemia    Hypertension    Lung nodules    Mitral regurgitation     Past Surgical History:  Procedure Laterality Date   BILATERAL SALPINGECTOMY Bilateral 12/14/2020   Procedure: BILATERAL SALPINGECTOMY;  Surgeon: Schermerhorn, Debby PARAS, MD;  Location: [facility];  Service: Gynecology;  Laterality: Bilateral;   CHOLECYSTECTOMY     TUBAL LIGATION  2011   VAGINAL HYSTERECTOMY N/A 12/14/2020   Procedure: HYSTERECTOMY VAGINAL;  Surgeon: Lovetta Debby PARAS, MD;  Location: [facility];  Service: Gynecology;  Laterality: N/A;   WISDOM TOOTH EXTRACTION      Social History   Socioeconomic History   Marital status: Single    Spouse name: Not on file   Number of children: 2   Years of education: Not on file   Highest education level: Not on file  Occupational History   Not on file  Tobacco Use    Smoking status: Never   Smokeless tobacco: Never  Substance and Sexual Activity   Alcohol use: Yes    Alcohol/week: 2.0 standard drinks of alcohol    Types: 1 Cans of beer, 1 Glasses of wine per week    Comment: 1-2   Drug use: Never   Sexual activity: Not on file  Other Topics Concern   Not on file  Social History Narrative   Not on file   Social Drivers of Health   Financial Resource Strain: Not on file  Food Insecurity: Not on file  Transportation Needs: Not on file  Physical Activity: Not on file  Stress: Not on file  Social Connections: Not on file  Intimate Partner Violence: Not on file    Family History  Problem Relation Age of Onset   Breast cancer Mother        8's   Bladder Cancer Father    Lung cancer Maternal Grandmother    Breast cancer Paternal Aunt     No Known Allergies  Review of Systems  Constitutional:  Negative for malaise/fatigue.  HENT: Negative.    Eyes:  Negative for blurred vision and pain.  Respiratory:  Negative for cough and shortness of breath.   Cardiovascular:  Negative for chest pain, palpitations, claudication and leg swelling.  Gastrointestinal:  Negative for abdominal  pain, blood in stool, constipation, diarrhea, nausea and vomiting.  Genitourinary:  Negative for dysuria, frequency and urgency.  Musculoskeletal: Negative.   Skin: Negative.   Neurological:  Negative for dizziness, tingling, sensory change and headaches.  Endo/Heme/Allergies: Negative.   Psychiatric/Behavioral: Negative.         Objective:   BP 122/84   Pulse 88   Ht 5' 9 (1.753 m)   Wt 204 lb 12.8 oz (92.9 kg)   LMP 03/27/2021   SpO2 99%   BMI 30.24 kg/m   Vitals:   09/07/24 0848  BP: 122/84  Pulse: 88  Height: 5' 9 (1.753 m)  Weight: 204 lb 12.8 oz (92.9 kg)  SpO2: 99%  BMI (Calculated): 30.23    Physical Exam Vitals and nursing note reviewed.  Constitutional:      Appearance: Normal appearance.  HENT:     Head: Normocephalic.  Eyes:      Extraocular Movements: Extraocular movements intact.     Pupils: Pupils are equal, round, and reactive to light.  Cardiovascular:     Rate and Rhythm: Normal rate and regular rhythm.     Pulses: Normal pulses.     Heart sounds: Normal heart sounds. No murmur heard. Pulmonary:     Effort: Pulmonary effort is normal. No respiratory distress.     Breath sounds: Normal breath sounds.  Abdominal:     General: There is no distension.     Tenderness: There is no abdominal tenderness.  Musculoskeletal:        General: No tenderness. Normal range of motion.     Cervical back: Normal range of motion and neck supple.     Right lower leg: No edema.     Left lower leg: No edema.  Skin:    General: Skin is warm and dry.     Coloration: Skin is not jaundiced.     Findings: No erythema.  Neurological:     General: No focal deficit present.     Mental Status: She is alert and oriented to person, place, and time.  Psychiatric:        Mood and Affect: Mood normal.        Speech: Speech normal.        Behavior: Behavior is cooperative.        Cognition and Memory: Memory is not impaired.      No results found for any visits on 09/07/24.  No results found for this or any previous visit (from the past 2160 hours).     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 - Continue medications as prescribed. - Check labs today  - Continue checking blood sugars fating at least once a day. - Stable. Vitamin B 12 deficiency Vitamin D  deficiency, unspecified Other fatigue - Check labs today - Supplementation recommended based off lab results and will notify patient at that time  - Patient  received B12 injection during today's visit. Environmental and seasonal allergies - Refill sent for Flonase nasal spray.    Return in about 3 months (around 12/08/2024).   Total time spent: 20 minutes  Ashley DELENA Cain, FNP  09/07/2024   This document may have been prepared by [facility] Voice Recognition software and as such  may include unintentional dictation errors.

## 2024-09-07 NOTE — Assessment & Plan Note (Addendum)
-   Check labs today - Supplementation recommended based off lab results and will notify patient at that time  - Patient received B12 injection during today's visit.

## 2024-09-07 NOTE — Assessment & Plan Note (Addendum)
-   Continue healthy diet and exercise as tolerated. - Continue medications as prescribed. - Check labs today  - Continue checking blood sugars fating at least once a day. - Stable.

## 2024-09-08 ENCOUNTER — Ambulatory Visit: Payer: Self-pay

## 2024-09-08 LAB — CBC WITH DIFFERENTIAL/PLATELET
Basophils Absolute: 0 x10E3/uL (ref 0.0–0.2)
Basos: 0 %
EOS (ABSOLUTE): 0.1 x10E3/uL (ref 0.0–0.4)
Eos: 2 %
Hematocrit: 37.3 % (ref 34.0–46.6)
Hemoglobin: 12 g/dL (ref 11.1–15.9)
Immature Grans (Abs): 0 x10E3/uL (ref 0.0–0.1)
Immature Granulocytes: 0 %
Lymphocytes Absolute: 1.4 x10E3/uL (ref 0.7–3.1)
Lymphs: 24 %
MCH: 30.9 pg (ref 26.6–33.0)
MCHC: 32.2 g/dL (ref 31.5–35.7)
MCV: 96 fL (ref 79–97)
Monocytes Absolute: 0.5 x10E3/uL (ref 0.1–0.9)
Monocytes: 8 %
Neutrophils Absolute: 3.7 x10E3/uL (ref 1.4–7.0)
Neutrophils: 66 %
Platelets: 284 x10E3/uL (ref 150–450)
RBC: 3.88 x10E6/uL (ref 3.77–5.28)
RDW: 11.9 % (ref 11.7–15.4)
WBC: 5.6 x10E3/uL (ref 3.4–10.8)

## 2024-09-08 LAB — HEMOGLOBIN A1C
Est. average glucose Bld gHb Est-mCnc: 100 mg/dL
Hgb A1c MFr Bld: 5.1 % (ref 4.8–5.6)

## 2024-09-08 LAB — CMP14+EGFR
ALT: 6 IU/L (ref 0–32)
AST: 10 IU/L (ref 0–40)
Albumin: 4.4 g/dL (ref 3.9–4.9)
Alkaline Phosphatase: 58 IU/L (ref 41–116)
BUN/Creatinine Ratio: 19 (ref 9–23)
BUN: 11 mg/dL (ref 6–24)
Bilirubin Total: 0.6 mg/dL (ref 0.0–1.2)
CO2: 21 mmol/L (ref 20–29)
Calcium: 9.8 mg/dL (ref 8.7–10.2)
Chloride: 102 mmol/L (ref 96–106)
Creatinine, Ser: 0.57 mg/dL (ref 0.57–1.00)
Globulin, Total: 2.4 g/dL (ref 1.5–4.5)
Glucose: 86 mg/dL (ref 70–99)
Potassium: 3.5 mmol/L (ref 3.5–5.2)
Sodium: 142 mmol/L (ref 134–144)
Total Protein: 6.8 g/dL (ref 6.0–8.5)
eGFR: 113 mL/min/1.73 (ref >59)

## 2024-09-08 LAB — TSH: TSH: 1.98 u[IU]/mL (ref 0.450–4.500)

## 2024-09-08 LAB — LIPID PANEL
Chol/HDL Ratio: 3.1 ratio (ref 0.0–4.4)
Cholesterol, Total: 171 mg/dL (ref 100–199)
HDL: 56 mg/dL (ref >39)
LDL Chol Calc (NIH): 96 mg/dL (ref 0–99)
Triglycerides: 106 mg/dL (ref 0–149)
VLDL Cholesterol Cal: 19 mg/dL (ref 5–40)

## 2024-09-08 LAB — VITAMIN D 25 HYDROXY (VIT D DEFICIENCY, FRACTURES): Vit D, 25-Hydroxy: 30.5 ng/mL (ref 30.0–100.0)

## 2024-09-08 LAB — VITAMIN B12: Vitamin B-12: 216 pg/mL — ABNORMAL LOW (ref 232–1245)

## 2024-09-20 ENCOUNTER — Other Ambulatory Visit: Payer: Self-pay | Admitting: Cardiology

## 2024-09-20 DIAGNOSIS — F411 Generalized anxiety disorder: Secondary | ICD-10-CM

## 2024-09-21 ENCOUNTER — Ambulatory Visit: Admitting: Family

## 2024-09-21 DIAGNOSIS — E538 Deficiency of other specified B group vitamins: Secondary | ICD-10-CM | POA: Diagnosis not present

## 2024-09-21 MED ORDER — CYANOCOBALAMIN 1000 MCG/ML IJ SOLN
1000.0000 ug | Freq: Once | INTRAMUSCULAR | Status: AC
  Administered 2024-09-21: 1000 ug via INTRAMUSCULAR

## 2024-09-21 NOTE — Progress Notes (Unsigned)
   CHIEF COMPLAINT  B12 Shot     REASON FOR VISIT  B12 Injection     ASSESSMENT  B12 Deficiency, Unspecified     PLAN  Diagnoses and all orders for this visit:  Vitamin B 12 deficiency -     cyanocobalamin  (VITAMIN B12) injection 1,000 mcg     Pt. given B12 injection in clinic.  Return for next injection per provider instructions.   Total time spent: 5 minutes  ALAN CHRISTELLA ARRANT, FNP  09/21/2024

## 2024-09-22 ENCOUNTER — Other Ambulatory Visit: Payer: Self-pay | Admitting: Family

## 2024-10-03 ENCOUNTER — Other Ambulatory Visit: Payer: Self-pay | Admitting: Family

## 2024-10-07 ENCOUNTER — Other Ambulatory Visit: Payer: Self-pay | Admitting: Family

## 2024-10-07 ENCOUNTER — Ambulatory Visit: Admitting: Family

## 2024-10-07 DIAGNOSIS — E538 Deficiency of other specified B group vitamins: Secondary | ICD-10-CM

## 2024-10-07 MED ORDER — CYANOCOBALAMIN 1000 MCG/ML IJ SOLN
1000.0000 ug | Freq: Once | INTRAMUSCULAR | Status: AC
  Administered 2024-10-07: 1000 ug via INTRAMUSCULAR

## 2024-10-07 MED ORDER — CYANOCOBALAMIN 1000 MCG/ML IJ SOLN: 1000.0000 ug | INTRAMUSCULAR | 1 refills | Status: AC

## 2024-10-07 NOTE — Progress Notes (Signed)
   CHIEF COMPLAINT  B12 Shot     REASON FOR VISIT  B12 Injection     ASSESSMENT  B12 Deficiency, Unspecified     PLAN  Diagnoses and all orders for this visit:  Vitamin B 12 deficiency -     cyanocobalamin  (VITAMIN B12) injection 1,000 mcg  Other orders -     cyanocobalamin  (VITAMIN B12) 1000 MCG/ML injection; Inject 1 mL (1,000 mcg total) into the muscle every 14 (fourteen) days.     Pt. given B12 injection in clinic.  Return for next injection per provider instructions.   Total time spent: 5 minutes  ALAN CHRISTELLA ARRANT, FNP  10/07/2024

## 2024-10-21 ENCOUNTER — Ambulatory Visit: Admitting: Family

## 2024-10-21 DIAGNOSIS — E538 Deficiency of other specified B group vitamins: Secondary | ICD-10-CM

## 2024-10-21 MED ORDER — CYANOCOBALAMIN 1000 MCG/ML IJ SOLN
1000.0000 ug | Freq: Once | INTRAMUSCULAR | Status: AC
  Administered 2024-10-21: 1000 ug via INTRAMUSCULAR

## 2024-10-23 NOTE — Progress Notes (Signed)
" ° °  CHIEF COMPLAINT  B12 Shot     REASON FOR VISIT  B12 Injection     ASSESSMENT  B12 Deficiency, Unspecified     PLAN  Diagnoses and all orders for this visit:  Vitamin B 12 deficiency -     cyanocobalamin  (VITAMIN B12) injection 1,000 mcg     Pt. given B12 injection in clinic.  Return for next injection per provider instructions.   Total time spent: 5 minutes  ALAN CHRISTELLA ARRANT, FNP  10/21/2024 "

## 2024-11-03 ENCOUNTER — Other Ambulatory Visit: Payer: Self-pay | Admitting: Family

## 2024-11-03 DIAGNOSIS — G43009 Migraine without aura, not intractable, without status migrainosus: Secondary | ICD-10-CM

## 2024-11-04 ENCOUNTER — Ambulatory Visit

## 2024-11-12 ENCOUNTER — Other Ambulatory Visit: Payer: Self-pay | Admitting: Family

## 2024-11-16 ENCOUNTER — Other Ambulatory Visit: Payer: Self-pay | Admitting: Family

## 2024-11-18 ENCOUNTER — Ambulatory Visit: Admitting: Family

## 2024-11-18 DIAGNOSIS — E538 Deficiency of other specified B group vitamins: Secondary | ICD-10-CM

## 2024-11-18 MED ORDER — CYANOCOBALAMIN 1000 MCG/ML IJ SOLN
1000.0000 ug | Freq: Once | INTRAMUSCULAR | Status: AC
  Administered 2024-11-18: 1000 ug via INTRAMUSCULAR

## 2024-11-19 ENCOUNTER — Encounter: Payer: Self-pay | Admitting: Family

## 2024-11-19 NOTE — Progress Notes (Signed)
" ° °  CHIEF COMPLAINT  B12 Shot     REASON FOR VISIT  B12 Injection     ASSESSMENT  B12 Deficiency, Unspecified     PLAN  Diagnoses and all orders for this visit:  Vitamin B 12 deficiency -     cyanocobalamin  (VITAMIN B12) injection 1,000 mcg     Pt. given B12 injection in clinic.  Return for next injection per provider instructions.   Total time spent: 5 minutes  ALAN CHRISTELLA ARRANT, FNP  11/18/2024 "

## 2024-12-02 ENCOUNTER — Ambulatory Visit

## 2024-12-15 ENCOUNTER — Ambulatory Visit: Admitting: Family
